# Patient Record
Sex: Female | Born: 1935 | Race: White | Hispanic: No | Smoking: Never smoker
Health system: Southern US, Community
[De-identification: ages and names within clinical notes are randomized; demographics above are authoritative.]

## PROBLEM LIST (undated history)

## (undated) DIAGNOSIS — C787 Secondary malignant neoplasm of liver and intrahepatic bile duct: Secondary | ICD-10-CM

## (undated) DIAGNOSIS — F32A Depression, unspecified: Secondary | ICD-10-CM

## (undated) DIAGNOSIS — I2510 Atherosclerotic heart disease of native coronary artery without angina pectoris: Secondary | ICD-10-CM

## (undated) DIAGNOSIS — C259 Malignant neoplasm of pancreas, unspecified: Secondary | ICD-10-CM

## (undated) DIAGNOSIS — I5031 Acute diastolic (congestive) heart failure: Secondary | ICD-10-CM

## (undated) DIAGNOSIS — F0280 Dementia in other diseases classified elsewhere without behavioral disturbance: Secondary | ICD-10-CM

## (undated) DIAGNOSIS — M1990 Unspecified osteoarthritis, unspecified site: Secondary | ICD-10-CM

## (undated) DIAGNOSIS — H269 Unspecified cataract: Secondary | ICD-10-CM

## (undated) DIAGNOSIS — M069 Rheumatoid arthritis, unspecified: Secondary | ICD-10-CM

## (undated) DIAGNOSIS — K1120 Sialoadenitis, unspecified: Secondary | ICD-10-CM

## (undated) DIAGNOSIS — E039 Hypothyroidism, unspecified: Secondary | ICD-10-CM

## (undated) DIAGNOSIS — G309 Alzheimer's disease, unspecified: Secondary | ICD-10-CM

## (undated) DIAGNOSIS — I10 Essential (primary) hypertension: Secondary | ICD-10-CM

## (undated) DIAGNOSIS — Z888 Allergy status to other drugs, medicaments and biological substances status: Secondary | ICD-10-CM

## (undated) DIAGNOSIS — F329 Major depressive disorder, single episode, unspecified: Secondary | ICD-10-CM

## (undated) HISTORY — DX: Acute diastolic (congestive) heart failure: I50.31

## (undated) HISTORY — DX: Unspecified osteoarthritis, unspecified site: M19.90

## (undated) HISTORY — DX: Sialoadenitis, unspecified: K11.20

## (undated) HISTORY — DX: Malignant neoplasm of pancreas, unspecified: C25.9

## (undated) HISTORY — DX: Alzheimer's disease, unspecified: G30.9

## (undated) HISTORY — DX: Major depressive disorder, single episode, unspecified: F32.9

## (undated) HISTORY — DX: Unspecified cataract: H26.9

## (undated) HISTORY — DX: Rheumatoid arthritis, unspecified: M06.9

## (undated) HISTORY — DX: Atherosclerotic heart disease of native coronary artery without angina pectoris: I25.10

## (undated) HISTORY — DX: Allergy status to other drugs, medicaments and biological substances: Z88.8

## (undated) HISTORY — DX: Dementia in other diseases classified elsewhere, unspecified severity, without behavioral disturbance, psychotic disturbance, mood disturbance, and anxiety: F02.80

## (undated) HISTORY — DX: Depression, unspecified: F32.A

## (undated) HISTORY — DX: Hypothyroidism, unspecified: E03.9

## (undated) HISTORY — DX: Essential (primary) hypertension: I10

## (undated) HISTORY — DX: Malignant neoplasm of pancreas, unspecified: C78.7

---

## 1963-09-07 HISTORY — PX: NASAL POLYP SURGERY: SHX186

## 1999-04-09 ENCOUNTER — Encounter: Payer: Self-pay | Admitting: Internal Medicine

## 1999-09-07 HISTORY — PX: HEMORRHOID SURGERY: SHX153

## 1999-09-07 LAB — HM COLONOSCOPY

## 2000-05-07 HISTORY — PX: ANAL SPHINCTEROTOMY: SHX1140

## 2004-08-26 ENCOUNTER — Ambulatory Visit: Payer: Self-pay | Admitting: Internal Medicine

## 2004-09-06 DIAGNOSIS — I2510 Atherosclerotic heart disease of native coronary artery without angina pectoris: Secondary | ICD-10-CM

## 2004-09-06 HISTORY — DX: Atherosclerotic heart disease of native coronary artery without angina pectoris: I25.10

## 2004-11-04 HISTORY — PX: CORONARY STENT PLACEMENT: SHX1402

## 2004-11-09 ENCOUNTER — Ambulatory Visit: Payer: Self-pay | Admitting: Internal Medicine

## 2004-11-12 ENCOUNTER — Ambulatory Visit: Payer: Self-pay | Admitting: Internal Medicine

## 2004-11-30 ENCOUNTER — Encounter: Payer: Self-pay | Admitting: Internal Medicine

## 2006-08-21 ENCOUNTER — Emergency Department: Payer: Self-pay | Admitting: Emergency Medicine

## 2006-08-23 ENCOUNTER — Ambulatory Visit: Payer: Self-pay | Admitting: Internal Medicine

## 2007-02-08 DIAGNOSIS — M949 Disorder of cartilage, unspecified: Secondary | ICD-10-CM

## 2007-02-08 DIAGNOSIS — I2510 Atherosclerotic heart disease of native coronary artery without angina pectoris: Secondary | ICD-10-CM | POA: Insufficient documentation

## 2007-02-08 DIAGNOSIS — E039 Hypothyroidism, unspecified: Secondary | ICD-10-CM | POA: Insufficient documentation

## 2007-02-08 DIAGNOSIS — M899 Disorder of bone, unspecified: Secondary | ICD-10-CM | POA: Insufficient documentation

## 2007-02-08 DIAGNOSIS — I10 Essential (primary) hypertension: Secondary | ICD-10-CM | POA: Insufficient documentation

## 2007-02-08 DIAGNOSIS — M069 Rheumatoid arthritis, unspecified: Secondary | ICD-10-CM | POA: Insufficient documentation

## 2007-02-08 DIAGNOSIS — F3289 Other specified depressive episodes: Secondary | ICD-10-CM | POA: Insufficient documentation

## 2007-02-08 DIAGNOSIS — F329 Major depressive disorder, single episode, unspecified: Secondary | ICD-10-CM

## 2007-02-09 ENCOUNTER — Ambulatory Visit: Payer: Self-pay | Admitting: Internal Medicine

## 2007-03-17 ENCOUNTER — Encounter (INDEPENDENT_AMBULATORY_CARE_PROVIDER_SITE_OTHER): Payer: Self-pay | Admitting: *Deleted

## 2007-08-09 ENCOUNTER — Telehealth: Payer: Self-pay | Admitting: Internal Medicine

## 2007-08-15 ENCOUNTER — Ambulatory Visit: Payer: Self-pay | Admitting: Internal Medicine

## 2007-08-15 DIAGNOSIS — E785 Hyperlipidemia, unspecified: Secondary | ICD-10-CM

## 2007-08-15 DIAGNOSIS — G589 Mononeuropathy, unspecified: Secondary | ICD-10-CM | POA: Insufficient documentation

## 2007-08-16 ENCOUNTER — Ambulatory Visit: Payer: Self-pay | Admitting: Cardiology

## 2007-08-16 LAB — CONVERTED CEMR LAB
ALT: 17 units/L (ref 0–35)
AST: 25 units/L (ref 0–37)
Albumin: 4.2 g/dL (ref 3.5–5.2)
Basophils Absolute: 0 10*3/uL (ref 0.0–0.1)
Basophils Relative: 0.2 % (ref 0.0–1.0)
Bilirubin, Direct: 0.2 mg/dL (ref 0.0–0.3)
Calcium: 9.9 mg/dL (ref 8.4–10.5)
Creatinine, Ser: 0.7 mg/dL (ref 0.4–1.2)
Free T4: 1 ng/dL (ref 0.6–1.6)
GFR calc Af Amer: 105 mL/min
Glucose, Bld: 92 mg/dL (ref 70–99)
HCT: 39.6 % (ref 36.0–46.0)
HDL: 48.3 mg/dL (ref >39.0)
Hemoglobin: 13.8 g/dL (ref 12.0–15.0)
LDL Cholesterol: 71 mg/dL (ref 0–99)
MCV: 95.1 fL (ref 78.0–100.0)
Neutrophils Relative %: 52.7 % (ref 43.0–77.0)
Potassium: 4.8 meq/L (ref 3.5–5.1)
RBC: 4.16 M/uL (ref 3.87–5.11)
RDW: 12.1 % (ref 11.5–14.6)
Sodium: 136 meq/L (ref 135–145)
TSH: 0.83 microintl units/mL (ref 0.35–5.50)
Total CHOL/HDL Ratio: 2.7
Total Protein: 6.7 g/dL (ref 6.0–8.3)
Triglycerides: 57 mg/dL (ref 0–149)
VLDL: 11 mg/dL (ref 0–40)
Vitamin B-12: 283 pg/mL (ref 211–911)
WBC: 5.9 10*3/uL (ref 4.5–10.5)

## 2007-08-30 ENCOUNTER — Telehealth (INDEPENDENT_AMBULATORY_CARE_PROVIDER_SITE_OTHER): Payer: Self-pay | Admitting: *Deleted

## 2007-11-05 DIAGNOSIS — K1120 Sialoadenitis, unspecified: Secondary | ICD-10-CM

## 2007-11-05 HISTORY — DX: Sialoadenitis, unspecified: K11.20

## 2008-01-17 ENCOUNTER — Encounter: Payer: Self-pay | Admitting: Internal Medicine

## 2008-04-02 ENCOUNTER — Ambulatory Visit: Payer: Self-pay | Admitting: Internal Medicine

## 2008-04-02 ENCOUNTER — Encounter: Payer: Self-pay | Admitting: Internal Medicine

## 2008-04-02 DIAGNOSIS — R21 Rash and other nonspecific skin eruption: Secondary | ICD-10-CM | POA: Insufficient documentation

## 2008-07-08 ENCOUNTER — Ambulatory Visit: Payer: Self-pay | Admitting: Internal Medicine

## 2008-07-09 LAB — CONVERTED CEMR LAB
BUN: 13 mg/dL (ref 6–23)
Calcium: 9.6 mg/dL (ref 8.4–10.5)
Chloride: 103 meq/L (ref 96–112)
Cholesterol: 137 mg/dL (ref 0–200)
Creatinine, Ser: 0.8 mg/dL (ref 0.4–1.2)
Free T4: 0.9 ng/dL (ref 0.6–1.6)
GFR calc Af Amer: 90 mL/min
Glucose, Bld: 91 mg/dL (ref 70–99)
HDL: 46.5 mg/dL (ref >39.0)
Hemoglobin: 13.8 g/dL (ref 12.0–15.0)
LDL Cholesterol: 66 mg/dL (ref 0–99)
MCV: 95.3 fL (ref 78.0–100.0)
Monocytes Absolute: 0.6 10*3/uL (ref 0.1–1.0)
Neutrophils Relative %: 57.1 % (ref 43.0–77.0)
Phosphorus: 3.7 mg/dL (ref 2.3–4.6)
TSH: 0.79 microintl units/mL (ref 0.35–5.50)
Total Bilirubin: 1.1 mg/dL (ref 0.3–1.2)
Total CHOL/HDL Ratio: 2.9
VLDL: 25 mg/dL (ref 0–40)

## 2008-07-31 ENCOUNTER — Telehealth (INDEPENDENT_AMBULATORY_CARE_PROVIDER_SITE_OTHER): Payer: Self-pay | Admitting: *Deleted

## 2008-07-31 ENCOUNTER — Encounter: Payer: Self-pay | Admitting: Internal Medicine

## 2008-09-11 ENCOUNTER — Telehealth: Payer: Self-pay | Admitting: Family Medicine

## 2008-09-18 ENCOUNTER — Telehealth: Payer: Self-pay | Admitting: Internal Medicine

## 2008-09-20 ENCOUNTER — Telehealth: Payer: Self-pay | Admitting: Internal Medicine

## 2008-10-01 ENCOUNTER — Ambulatory Visit: Payer: Self-pay | Admitting: Internal Medicine

## 2008-10-01 DIAGNOSIS — K1120 Sialoadenitis, unspecified: Secondary | ICD-10-CM | POA: Insufficient documentation

## 2008-10-02 ENCOUNTER — Ambulatory Visit: Payer: Self-pay | Admitting: Internal Medicine

## 2008-10-02 ENCOUNTER — Telehealth: Payer: Self-pay | Admitting: Internal Medicine

## 2008-10-03 ENCOUNTER — Ambulatory Visit: Payer: Self-pay | Admitting: Internal Medicine

## 2008-10-04 ENCOUNTER — Ambulatory Visit: Payer: Self-pay | Admitting: Internal Medicine

## 2008-10-14 ENCOUNTER — Telehealth: Payer: Self-pay | Admitting: Internal Medicine

## 2008-10-15 ENCOUNTER — Telehealth: Payer: Self-pay | Admitting: Internal Medicine

## 2008-11-25 ENCOUNTER — Telehealth: Payer: Self-pay | Admitting: Internal Medicine

## 2009-01-09 ENCOUNTER — Telehealth: Payer: Self-pay | Admitting: Internal Medicine

## 2009-01-27 ENCOUNTER — Ambulatory Visit: Payer: Self-pay | Admitting: Internal Medicine

## 2009-01-27 DIAGNOSIS — R5381 Other malaise: Secondary | ICD-10-CM

## 2009-01-27 DIAGNOSIS — R5383 Other fatigue: Secondary | ICD-10-CM

## 2009-01-28 ENCOUNTER — Telehealth: Payer: Self-pay | Admitting: Internal Medicine

## 2009-01-29 LAB — CONVERTED CEMR LAB
BUN: 12 mg/dL (ref 6–23)
Basophils Relative: 0.9 % (ref 0.0–3.0)
Bilirubin, Direct: 0.2 mg/dL (ref 0.0–0.3)
CO2: 29 meq/L (ref 19–32)
Calcium: 9.4 mg/dL (ref 8.4–10.5)
Creatinine, Ser: 0.8 mg/dL (ref 0.4–1.2)
HCT: 39.6 % (ref 36.0–46.0)
Hemoglobin: 13.6 g/dL (ref 12.0–15.0)
Lymphocytes Relative: 29.6 % (ref 12.0–46.0)
Lymphs Abs: 2.3 10*3/uL (ref 0.7–4.0)
MCHC: 34.2 g/dL (ref 30.0–36.0)
MCV: 97 fL (ref 78.0–100.0)
Monocytes Relative: 3 % (ref 3.0–12.0)
Neutrophils Relative %: 63.9 % (ref 43.0–77.0)
Phosphorus: 4.1 mg/dL (ref 2.3–4.6)
Platelets: 235 10*3/uL (ref 150.0–400.0)
Potassium: 5.1 meq/L (ref 3.5–5.1)
RDW: 11.6 % (ref 11.5–14.6)
Sodium: 137 meq/L (ref 135–145)
TSH: 0.59 microintl units/mL (ref 0.35–5.50)
Total Bilirubin: 1.3 mg/dL — ABNORMAL HIGH (ref 0.3–1.2)

## 2009-02-10 ENCOUNTER — Telehealth (INDEPENDENT_AMBULATORY_CARE_PROVIDER_SITE_OTHER): Payer: Self-pay | Admitting: Radiology

## 2009-02-21 ENCOUNTER — Telehealth: Payer: Self-pay | Admitting: Internal Medicine

## 2009-03-17 ENCOUNTER — Telehealth: Payer: Self-pay | Admitting: Internal Medicine

## 2009-03-20 ENCOUNTER — Encounter: Payer: Self-pay | Admitting: Cardiology

## 2009-03-20 ENCOUNTER — Ambulatory Visit: Payer: Self-pay | Admitting: Cardiology

## 2009-04-01 ENCOUNTER — Telehealth (INDEPENDENT_AMBULATORY_CARE_PROVIDER_SITE_OTHER): Payer: Self-pay | Admitting: Radiology

## 2009-04-02 ENCOUNTER — Encounter: Payer: Self-pay | Admitting: Cardiology

## 2009-04-02 ENCOUNTER — Encounter: Payer: Self-pay | Admitting: Cardiovascular Disease

## 2009-04-02 ENCOUNTER — Ambulatory Visit: Payer: Self-pay

## 2009-04-04 ENCOUNTER — Telehealth: Payer: Self-pay | Admitting: Internal Medicine

## 2009-04-09 ENCOUNTER — Ambulatory Visit: Payer: Self-pay | Admitting: Cardiology

## 2009-04-09 DIAGNOSIS — I5032 Chronic diastolic (congestive) heart failure: Secondary | ICD-10-CM

## 2009-04-24 ENCOUNTER — Telehealth: Payer: Self-pay | Admitting: Internal Medicine

## 2009-05-20 ENCOUNTER — Inpatient Hospital Stay: Payer: Medicare Other | Admitting: Internal Medicine

## 2009-05-20 ENCOUNTER — Ambulatory Visit: Payer: Self-pay | Admitting: Cardiology

## 2009-05-20 ENCOUNTER — Encounter (INDEPENDENT_AMBULATORY_CARE_PROVIDER_SITE_OTHER): Payer: Self-pay | Admitting: *Deleted

## 2009-05-20 ENCOUNTER — Encounter: Payer: Self-pay | Admitting: Internal Medicine

## 2009-05-20 LAB — CONVERTED CEMR LAB
CO2: 29 meq/L
Calcium: 8.7 mg/dL
Chloride: 101 meq/L
Creatinine, Ser: 0.81 mg/dL
HDL: 43 mg/dL
Hemoglobin: 13.7 g/dL
Platelets: 247 10*3/uL
Sodium: 137 meq/L
Triglyceride fasting, serum: 154 mg/dL

## 2009-05-26 ENCOUNTER — Encounter (INDEPENDENT_AMBULATORY_CARE_PROVIDER_SITE_OTHER): Payer: Self-pay | Admitting: *Deleted

## 2009-05-26 ENCOUNTER — Ambulatory Visit: Payer: Self-pay | Admitting: Internal Medicine

## 2009-05-26 DIAGNOSIS — R51 Headache: Secondary | ICD-10-CM

## 2009-05-26 DIAGNOSIS — R519 Headache, unspecified: Secondary | ICD-10-CM | POA: Insufficient documentation

## 2009-06-23 ENCOUNTER — Telehealth: Payer: Self-pay | Admitting: Internal Medicine

## 2009-09-06 DIAGNOSIS — M069 Rheumatoid arthritis, unspecified: Secondary | ICD-10-CM

## 2009-09-06 HISTORY — DX: Rheumatoid arthritis, unspecified: M06.9

## 2009-09-23 ENCOUNTER — Ambulatory Visit: Payer: Self-pay | Admitting: Internal Medicine

## 2009-09-24 LAB — CONVERTED CEMR LAB
BUN: 10 mg/dL (ref 6–23)
CO2: 29 meq/L (ref 19–32)
Calcium: 9.5 mg/dL (ref 8.4–10.5)
Cholesterol: 151 mg/dL (ref 0–200)
Creatinine, Ser: 0.7 mg/dL (ref 0.4–1.2)
Eosinophils Relative: 1.9 % (ref 0.0–5.0)
HCT: 41.9 % (ref 36.0–46.0)
HDL: 47.1 mg/dL (ref >39.00)
Hemoglobin: 14.1 g/dL (ref 12.0–15.0)
LDL Cholesterol: 74 mg/dL (ref 0–99)
Lymphocytes Relative: 29.4 % (ref 12.0–46.0)
MCHC: 33.6 g/dL (ref 30.0–36.0)
Neutrophils Relative %: 61.8 % (ref 43.0–77.0)
Platelets: 236 10*3/uL (ref 150.0–400.0)
Potassium: 4.9 meq/L (ref 3.5–5.1)
RBC: 4.32 M/uL (ref 3.87–5.11)
RDW: 12 % (ref 11.5–14.6)
TSH: 0.74 microintl units/mL (ref 0.35–5.50)
Total Bilirubin: 2 mg/dL — ABNORMAL HIGH (ref 0.3–1.2)
Triglycerides: 151 mg/dL — ABNORMAL HIGH (ref 0.0–149.0)
VLDL: 30.2 mg/dL (ref 0.0–40.0)
WBC: 6.5 10*3/uL (ref 4.5–10.5)

## 2009-10-21 ENCOUNTER — Telehealth: Payer: Self-pay | Admitting: Internal Medicine

## 2009-10-23 ENCOUNTER — Telehealth: Payer: Self-pay | Admitting: Internal Medicine

## 2009-11-25 ENCOUNTER — Ambulatory Visit: Payer: Self-pay | Admitting: Internal Medicine

## 2009-11-25 DIAGNOSIS — J019 Acute sinusitis, unspecified: Secondary | ICD-10-CM

## 2009-12-17 ENCOUNTER — Observation Stay: Payer: Medicare Other | Admitting: Internal Medicine

## 2009-12-17 ENCOUNTER — Encounter: Payer: Self-pay | Admitting: Internal Medicine

## 2009-12-18 ENCOUNTER — Ambulatory Visit: Payer: Self-pay | Admitting: Cardiology

## 2009-12-18 ENCOUNTER — Encounter: Payer: Self-pay | Admitting: Internal Medicine

## 2009-12-19 ENCOUNTER — Telehealth: Payer: Self-pay | Admitting: Internal Medicine

## 2009-12-22 ENCOUNTER — Encounter: Payer: Self-pay | Admitting: Internal Medicine

## 2009-12-23 ENCOUNTER — Encounter: Payer: Self-pay | Admitting: Internal Medicine

## 2010-01-02 ENCOUNTER — Ambulatory Visit: Payer: Self-pay | Admitting: Cardiology

## 2010-01-06 ENCOUNTER — Encounter: Payer: Self-pay | Admitting: Internal Medicine

## 2010-01-07 ENCOUNTER — Telehealth: Payer: Self-pay | Admitting: Internal Medicine

## 2010-01-13 ENCOUNTER — Ambulatory Visit: Payer: Self-pay | Admitting: Internal Medicine

## 2010-01-13 DIAGNOSIS — M19049 Primary osteoarthritis, unspecified hand: Secondary | ICD-10-CM

## 2010-01-14 LAB — CONVERTED CEMR LAB: Vitamin B-12: 314 pg/mL (ref 211–911)

## 2010-01-29 ENCOUNTER — Telehealth (INDEPENDENT_AMBULATORY_CARE_PROVIDER_SITE_OTHER): Payer: Self-pay | Admitting: *Deleted

## 2010-02-25 ENCOUNTER — Ambulatory Visit: Payer: Self-pay | Admitting: Internal Medicine

## 2010-02-25 LAB — CONVERTED CEMR LAB
Chloride: 104 meq/L (ref 96–112)
Creatinine, Ser: 0.6 mg/dL (ref 0.4–1.2)
Glucose, Bld: 95 mg/dL (ref 70–99)
Sodium: 138 meq/L (ref 135–145)

## 2010-02-27 ENCOUNTER — Encounter: Admission: RE | Admit: 2010-02-27 | Discharge: 2010-02-27 | Payer: Self-pay | Admitting: Internal Medicine

## 2010-06-02 ENCOUNTER — Telehealth: Payer: Self-pay | Admitting: Internal Medicine

## 2010-06-11 ENCOUNTER — Telehealth: Payer: Self-pay | Admitting: Internal Medicine

## 2010-06-19 ENCOUNTER — Ambulatory Visit: Payer: Self-pay | Admitting: Internal Medicine

## 2010-06-19 DIAGNOSIS — G309 Alzheimer's disease, unspecified: Secondary | ICD-10-CM

## 2010-06-19 DIAGNOSIS — F0280 Dementia in other diseases classified elsewhere without behavioral disturbance: Secondary | ICD-10-CM

## 2010-08-24 ENCOUNTER — Ambulatory Visit: Payer: Self-pay | Admitting: Internal Medicine

## 2010-08-24 ENCOUNTER — Telehealth: Payer: Self-pay | Admitting: Internal Medicine

## 2010-08-28 ENCOUNTER — Ambulatory Visit: Payer: Self-pay | Admitting: Internal Medicine

## 2010-10-04 LAB — CONVERTED CEMR LAB
Free T4: 0.88 ng/dL (ref 0.80–1.80)
TSH: 1.894 microintl units/mL (ref 0.350–4.500)

## 2010-10-06 NOTE — Assessment & Plan Note (Signed)
Summary: 3 M F/U DLO  R/S FROM 06/05/10,06/12/10   Vital Signs:  Patient profile:   77 year old female Weight:      170 pounds Temp:     97.2 degrees F oral Pulse rate:   72 / minute Pulse rhythm:   regular BP sitting:   128 / 80  (left arm) Cuff size:   regular  Vitals Entered By: Mervin Hack CMA Duncan Dull) (June 19, 2010 2:38 PM) CC: 3 month follow-up   History of Present Illness: Dizziness for 1 day last week went away quickly without meds  reviewed benign MRI No clear cut vascular lesions to account for memory problmes  Had recent trip with friends in [place] Now planning trip with these friends to [facility] felt that she was no worse off then they were she has some ongoing memory issues Left her purse in hotel in [place] and didn't realize  overall feels better --better health seems better off some of the meds really enjoyed her chance to have social outing of significance Has a great group where she lives now  [employer] now hasn't given up any other activities Daughter notices that she still repeats herself--- on the ride home from the airport, she told her about the show they saw 3 different times as if they were the first time  Allergies: 1)  ! Aspirin (Aspirin)  Past History:  Past medical, surgical, family and social histories (including risk factors) reviewed for relevance to current acute and chronic problems.  Past Medical History: 1.  CAD:  Anterior MI in [place] in 2006.  2 stents in LAD, 1 stent in PDA.   - Adenosine myoview (7/10): EF 60%, septal apical infarct.  Low risk study.  - Adenosine myoview (4/11): EF > 55%, mid anteroseptal, apical septal, and true apex infarct with no ischemia.  Low risk study.  2 . HTN 3.  Rheumatoid arthritis -----diagnosis in question 2011 4.  Hyperlipidemia 5.  2 prior bouts of parotitis 6.  Hypothyroidism 7.  Depression 8.  Allergy to aspirin: description sounds like anaphylaxis.  9.  Diastolic CHF: echo  (7/10) EF 44-03%, mid to apical anteroseptal hypokinesis, diastolic dysfunction, PASP 33 mmHg.  10.Osteoarthritis 11. Altzheimers  Past Surgical History: Reviewed history from 07/08/2008 and no changes required. 1987 Hysterectomy 9/01  Anal sphincterostomy and fissurectomy - [facility] 1965  Nasal polypectomy           Cataracts 8/99   DEXA 1/01   Hemorrhoid ligation 3/06   MI/3 stents 3/09  Infected salivary gland on left  Family History: Reviewed history from 02/08/2007 and no changes required. Dad died @77  MI No cancer  Social History: Reviewed history from 03/20/2009 and no changes required. Divorced then remarried--widowed 10/02.  5 daughters.  Never Smoked Alcohol use-yes--occ Lived in [place] but has moved to [place].   Review of Systems       appetite is okay sleeps fairly well  Physical Exam  General:  alert and normal appearance.   Psych:  normally interactive, good eye contact, not anxious appearing, and not depressed appearing.     Impression & Recommendations:  Problem # 1:  ALZHEIMERS DISEASE (ICD-331.0) Assessment New seems to fit the criteria daughter notes ongoing sig memory issues but no serious functional issues No worse now  will try donepezil trial  Complete Medication List: 1)  Synthroid 75 Mcg Tabs (Levothyroxine sodium) .Marland Kitchen.. 1 by mouth daily 2)  Plavix 75 Mg Tabs (Clopidogrel bisulfate) .... Take  1 tablet by mouth once a day 3)  Fluoxetine Hcl 20 Mg Caps (Fluoxetine hcl) .... Take 1 tablet by mouth once a day 4)  Carvedilol 12.5 Mg Tabs (Carvedilol) .... 1/2 two times a day 5)  Furosemide 20 Mg Tabs (Furosemide) .... Take one tablet by mouth every other day 6)  Potassium Chloride Cr 10 Meq Cr-caps (Potassium chloride) .... Take one tablet by mouth every other day with furosemide 7)  Calcium 600 1500 Mg Tabs (Calcium carbonate) .Marland Kitchen.. 1 by mouth daily 8)  Meclizine Hcl 25 Mg Tabs (Meclizine hcl) .... 1/2-1 three times a day as needed  for dizziness 9)  Astelin 137 Mcg/spray Soln (Azelastine hcl) .Marland Kitchen.. 1 spray in each nostril two times a day as needed for allergy symptoms 10)  Donepezil Hcl 5 Mg Tabs (Donepezil hcl) .Marland Kitchen.. 1 tab daily for memory loss 11)  Lorazepam 0.5 Mg Tabs (Lorazepam) .... 1/2 by mouth two times a day as needed for nerves  Other Orders: Pneumococcal Vaccine (16109) Admin 1st Vaccine (60454) Admin 1st Vaccine [facility]) [phone]) Influenza Vaccine MCR [phone])  Patient Instructions: 1)  Please schedule a follow-up appointment in 2 months.  Prescriptions: DONEPEZIL HCL 5 MG TABS (DONEPEZIL HCL) 1 tab daily for memory loss  #30 x 11   Entered and Authorized by:   Cindee Salt MD   Signed by:   Cindee Salt MD on 06/19/2010   Method used:   [id number]       [address]       [place], [place]  [address]       Ph: [phone]       Fax: [phone]   RxID:   [id number]   Current Allergies (reviewed today): ! ASPIRIN (ASPIRIN)   Pneumovax Vaccine    Vaccine Type: Pneumovax (Medicare)    Site: right deltoid    Mfr: Merck    Dose: 0.5 ml    Route: IM    Given by: Mervin Hack CMA (AAMA)    Exp. Date: 11/19/2011    Lot #: [id number]    VIS given: 08/11/09 version given June 19, 2010.  Influenza Vaccine    Vaccine Type: Fluvax MCR    Site: left deltoid    Mfr: GlaxoSmithKline    Dose: 0.5 ml    Route: IM    Given by: Mervin Hack CMA (AAMA)    Exp. Date: 03/06/2011    Lot #: [id number]    VIS given: 03/31/10 version given June 19, 2010.  Flu Vaccine Consent Questions    Do you have a history of severe allergic reactions to this vaccine? no    Any prior history of allergic reactions to egg and/or gelatin? no    Do you have a sensitivity to the preservative Thimersol? no    Do you have a past history of Guillan-Barre Syndrome? no    Do you currently have an acute febrile illness? no    Have you ever had a severe  reaction to latex? no    Vaccine information given and explained to patient? yes    Are you currently pregnant? no    Appended Document: 3 M F/U DLO  R/S FROM 06/05/10,06/12/10 Generic aricept called to [facility], they didnt get it on 06/19/10.

## 2010-10-06 NOTE — Assessment & Plan Note (Signed)
Summary: 4 M F/U DLO   Vital Signs:  Patient profile:   77 year old female Weight:      160 pounds Temp:     98.3 degrees F oral BP sitting:   128 / 80  (left arm) Cuff size:   regular  Vitals Entered By: Mervin Hack CMA Duncan Dull) (September 23, 2009 10:42 AM) CC: follow-up   History of Present Illness: Doing fairly well Loves to read--now uses a [place] and this has made it much better on her hands  Has moved into condo at [employer] Really loves it  Tries to be cautious about her activities Travels at the right times for errands, etc No restrictions though  No recent heart trouble Notes some discomfort on right chest Tries to straighten out to help it Points to  ~3rd costocartilage area  Arthritis pain continues still restricted with hands Status is stable  Allergies: 1)  ! Aspirin (Aspirin)  Past History:  Past medical, surgical, family and social histories (including risk factors) reviewed for relevance to current acute and chronic problems.  Past Medical History: Reviewed history from 04/09/2009 and no changes required. 1.  CAD:  Anterior MI in [place] in 2006.  2 stents in LAD, 1 stent in PDA.   - Adenosine myoview (7/10): EF 60%, small septal apical infarct.  Low risk study.  2 . HTN 3.  Rheumatoid arthritis on MTX 4.  Hyperlipidemia 5.  2 prior bouts of parotitis 6.  Hypothyroidism 7.  Depression 8.  Allergy to aspirin: description sounds like anaphylaxis.  9.  Diastolic CHF: echo (7/10) EF 50-55%, mid to apical anteroseptal hypokinesis, diastolic dysfunction, PASP 33 mmHg.   Past Surgical History: Reviewed history from 07/08/2008 and no changes required. 1987 Hysterectomy 9/01  Anal sphincterostomy and fissurectomy - [facility] 1965  Nasal polypectomy           Cataracts 8/99   DEXA 1/01   Hemorrhoid ligation 3/06   MI/3 stents 3/09  Infected salivary gland on left  Family History: Reviewed history from 02/08/2007 and no changes required. Dad  died @77  MI No cancer  Social History: Reviewed history from 03/20/2009 and no changes required. Divorced then remarried--widowed 10/02.  5 daughters.  Never Smoked Alcohol use-yes--occ Lived in [place], [place] but has moved to [place].   Review of Systems       appetite is okay--not a big eater weight down 4# sleeps okay   Physical Exam  General:  alert and normal appearance.   Neck:  supple, no masses, no thyromegaly, no carotid bruits, and no cervical lymphadenopathy.   Lungs:  normal respiratory effort and normal breath sounds.   Heart:  normal rate, regular rhythm, no murmur, and no gallop.   Msk:  same  ulnar deviation and thickening in hands Extremities:  no edema Psych:  normally interactive, good eye contact, not anxious appearing, and not depressed appearing.     Impression & Recommendations:  Problem # 1:  DIASTOLIC HEART FAILURE, CHRONIC (ICD-428.32) Assessment Unchanged good functional status no changes needed  Her updated medication list for this problem includes:    Plavix 75 Mg Tabs (Clopidogrel bisulfate) .Marland Kitchen... Take 1 tablet by mouth once a day    Carvedilol 12.5 Mg Tabs (Carvedilol) .Marland Kitchen... 1 two times a day    Furosemide 20 Mg Tabs (Furosemide) .Marland Kitchen... Take one tablet by mouth every other day  Problem # 2:  RHEUMATOID ARTHRITIS (ICD-714.0) Assessment: Unchanged stable status needs labs again  Her updated  medication list for this problem includes:    Methotrexate 2.5 Mg Tabs (Methotrexate sodium) .Marland KitchenMarland KitchenMarland KitchenMarland Kitchen 4 tabs every week  Problem # 3:  CORONARY ARTERY DISEASE (ICD-414.00) Assessment: Unchanged no recent symptoms no changes needed  Her updated medication list for this problem includes:    Plavix 75 Mg Tabs (Clopidogrel bisulfate) .Marland Kitchen... Take 1 tablet by mouth once a day    Carvedilol 12.5 Mg Tabs (Carvedilol) .Marland Kitchen... 1 two times a day    Furosemide 20 Mg Tabs (Furosemide) .Marland Kitchen... Take one tablet by mouth every other day  Problem # 4:  DEPRESSION  (ICD-311) Assessment: Unchanged mood has been fine  Her updated medication list for this problem includes:    Lorazepam 1 Mg Tabs (Lorazepam) .Marland Kitchen... Take one by mouth twice a day    Fluoxetine Hcl 20 Mg Caps (Fluoxetine hcl) .Marland Kitchen... Take 1 tablet by mouth once a day  Problem # 5:  HYPOTHYROIDISM (ICD-244.9) Assessment: Unchanged no changes needed  Her updated medication list for this problem includes:    Synthroid 75 Mcg Tabs (Levothyroxine sodium) .Marland Kitchen... 1 by mouth daily  Labs Reviewed: TSH: 1.2 (05/20/2009)    Chol: 133 (01/27/2009)   HDL: 43 (05/20/2009)   LDL: 73 (05/20/2009)   TG: 154 (05/20/2009)  Complete Medication List: 1)  Methotrexate 2.5 Mg Tabs (Methotrexate sodium) .... 4 tabs every week 2)  Folic Acid 1 Mg Tabs (Folic acid) .Marland Kitchen.. 1 by mouth daily 3)  Synthroid 75 Mcg Tabs (Levothyroxine sodium) .Marland Kitchen.. 1 by mouth daily 4)  Calcium 600 1500 Mg Tabs (Calcium carbonate) .Marland Kitchen.. 1 by mouth daily 5)  Simvastatin 40 Mg Tabs (Simvastatin) .... Take 1 tablet by mouth once a day 6)  Plavix 75 Mg Tabs (Clopidogrel bisulfate) .... Take 1 tablet by mouth once a day 7)  Lorazepam 1 Mg Tabs (Lorazepam) .... Take one by mouth twice a day 8)  Fluoxetine Hcl 20 Mg Caps (Fluoxetine hcl) .... Take 1 tablet by mouth once a day 9)  Astelin 137 Mcg/spray Soln (Azelastine hcl) .... As directed 10)  Triamcinolone Acetonide 0.1 % Lotn (Triamcinolone acetonide) .... Apply to rash three times a day as needed till clear 11)  Carvedilol 12.5 Mg Tabs (Carvedilol) .Marland Kitchen.. 1 two times a day 12)  Valtrex 1 Gm Tabs (Valacyclovir hcl) .... 2 at onset of cold sore then repeat in 12 hours 13)  Furosemide 20 Mg Tabs (Furosemide) .... Take one tablet by mouth every other day 14)  Potassium Chloride Cr 10 Meq Cr-caps (Potassium chloride) .... Take one tablet by mouth every other day with furosemide  Other Orders: TLB-Lipid Panel (80061-LIPID) TLB-Hepatic/Liver Function Pnl (80076-HEPATIC) TLB-Renal Function Panel  (80069-RENAL) TLB-CBC Platelet - w/Differential (85025-CBCD) TLB-TSH (Thyroid Stimulating Hormone) (84443-TSH) Venipuncture (16109)  Patient Instructions: 1)  Please schedule a follow-up appointment in 4 months .   Current Allergies (reviewed today): ! ASPIRIN (ASPIRIN)

## 2010-10-06 NOTE — Letter (Signed)
[facility]   Im[facility] By: Beau Fanny 12/19/2009 10:07:55  _____________________________________________________________________  External Attachment:    Type:   Image     Comment:   External Document

## 2010-10-06 NOTE — Assessment & Plan Note (Signed)
Summary: 4-6 WEEK FOLLOW UP/[facility]   Vital Signs:  Patient profile:   77 year old female Weight:      170 pounds Temp:     98.5 degrees F oral Pulse rate:   72 / minute Pulse rhythm:   regular BP sitting:   130 / 80  (left arm) Cuff size:   regular  Vitals Entered By: Mervin Hack CMA Duncan Dull) (Tyniesha 22, 2011 11:31 AM) CC: 4-6 week follow-up   History of Present Illness: Off statin and hasn't taken any lorazepam since last visit Did note some equilibrium problems before---this seems to be better no longer having orthostatic dizziness hasn't been taking carvedilol two times a day---discussed that she should use it two times a day   Daughter here She notes no changes Seems more disoriented than memory problems Got up at 4AM and thought it was time to get up---went inot shower, etc in hotel room  Allergies: 1)  ! Aspirin (Aspirin)  Past History:  Past medical, surgical, family and social histories (including risk factors) reviewed for relevance to current acute and chronic problems.  Past Medical History: Reviewed history from 01/13/2010 and no changes required. 1.  CAD:  Anterior MI in [place] in 2006.  2 stents in LAD, 1 stent in PDA.   - Adenosine myoview (7/10): EF 60%, septal apical infarct.  Low risk study.  - Adenosine myoview (4/11): EF > 55%, mid anteroseptal, apical septal, and true apex infarct with no ischemia.  Low risk study.  2 . HTN 3.  Rheumatoid arthritis -----diagnosis in question 2011 4.  Hyperlipidemia 5.  2 prior bouts of parotitis 6.  Hypothyroidism 7.  Depression 8.  Allergy to aspirin: description sounds like anaphylaxis.  9.  Diastolic CHF: echo (7/10) EF 50-55%, mid to apical anteroseptal hypokinesis, diastolic dysfunction, PASP 33 mmHg.  10.Osteoarthritis  Past Surgical History: Reviewed history from 07/08/2008 and no changes required. 1987 Hysterectomy 9/01  Anal sphincterostomy and fissurectomy - [facility] 1965  Nasal polypectomy  Cataracts 8/99   DEXA 1/01   Hemorrhoid ligation 3/06   MI/3 stents 3/09  Infected salivary gland on left  Family History: Reviewed history from 02/08/2007 and no changes required. Dad died @77  MI No cancer  Social History: Reviewed history from 03/20/2009 and no changes required. Divorced then remarried--widowed 10/02.  5 daughters.  Never Smoked Alcohol use-yes--occ Lived in [place], [place] but has moved to [place].   Review of Systems       no incontinence duaghter does note that her walking is off some---seems to "miss" steps at times  Physical Exam  General:  alert.  NAD Psych:  normally interactive, good eye contact, not anxious appearing, and not depressed appearing.  Still defensive and in denial   Impression & Recommendations:  Problem # 1:  MEMORY LOSS (ICD-780.93) Assessment Comment Only  counselled 15 minutes some gait problems---will check MRI to make sure no NPH if vascular issues---probably needs the statin again  will recheck in 3 months----I would recommend trial of aricept if no other diagnosis  Orders: Radiology Referral (Radiology)  Complete Medication List: 1)  Synthroid 75 Mcg Tabs (Levothyroxine sodium) .Marland Kitchen.. 1 by mouth daily 2)  Plavix 75 Mg Tabs (Clopidogrel bisulfate) .... Take 1 tablet by mouth once a day 3)  Fluoxetine Hcl 20 Mg Caps (Fluoxetine hcl) .... Take 1 tablet by mouth once a day 4)  Carvedilol 12.5 Mg Tabs (Carvedilol) .... 1/2 two times a day 5)  Furosemide 20 Mg Tabs (Furosemide) .Marland KitchenMarland KitchenMarland Kitchen  Take one tablet by mouth every other day 6)  Potassium Chloride Cr 10 Meq Cr-caps (Potassium chloride) .... Take one tablet by mouth every other day with furosemide 7)  Calcium 600 1500 Mg Tabs (Calcium carbonate) .Marland Kitchen.. 1 by mouth daily  Patient Instructions: 1)  Please schedule a follow-up appointment in 3 months .  2)  Please set up MRI  Current Allergies (reviewed today): ! ASPIRIN (ASPIRIN)  Appended Document: Orders  Update    Clinical Lists Changes  Orders: Added new Service order of Venipuncture (16109) - Signed Added new Test order of TLB-BMP (Basic Metabolic Panel-BMET) (80048-METABOL) - Signed

## 2010-10-06 NOTE — Progress Notes (Signed)
Summary: needs something for dizziness  Phone Note Call from Patient Call back at Home Phone [phone]   Caller: Patient Call For: Cindee Salt MD Summary of Call: Pt states she has had some dizziness since the week end.  This seems to be getting worse.  She thought she had an appt on friday, for a 3 month follow up,  but I told her that has been changed to 10/07 and she says she will not be in town that day.   She is asking if you can work her in this week, and also if something can be called to [facility] for the dizziness. Initial call taken by: Lowella Petties CMA,  June 02, 2010 11:22 AM  Follow-up for Phone Call        can work her in tomorrow at 12:30PM or else it will have to wait till next week  Have her try OTC meclizine 25 mg 1/2-1 three times a day as needed for dizziness until her evaluation Follow-up by: Cindee Salt MD,  June 02, 2010 2:14 PM  Additional Follow-up for Phone Call Additional follow up Details #1::        rx sent to pharmacy, pt will call for appt if medication doesn't work. Additional Follow-up by: Mervin Hack CMA Duncan Dull),  June 02, 2010 3:16 PM    New/Updated Medications: MECLIZINE HCL 25 MG TABS (MECLIZINE HCL) 1/2-1 three times a day as needed for dizziness Prescriptions: MECLIZINE HCL 25 MG TABS (MECLIZINE HCL) 1/2-1 three times a day as needed for dizziness  #30 x 0   Entered by:   Mervin Hack CMA (AAMA)   Authorized by:   Cindee Salt MD   Signed by:   Mervin Hack CMA (AAMA) on 06/02/2010   Method used:   [facility]        [id number]       [address]       [place], [place]  [address]       Ph: [phone]       Fax: [phone]   RxID:   [id number]

## 2010-10-06 NOTE — Progress Notes (Signed)
Summary: refill request for astelin  Phone Note Refill Request Call back at Home Phone [phone] Message from:  Patient  Refills Requested: Medication #1:  astelin nasal spray Phoned request from pt, this is no longer on med list, pt says she has not used it in awhile but needs it now.  Please send to [facility].  Initial call taken by: Lowella Petties CMA,  June 11, 2010 10:28 AM  Follow-up for Phone Call        Okay to send #1 x 5 refills 1 spray in each nostril two times a day as needed for allergy symptoms Follow-up by: Cindee Salt MD,  June 11, 2010 1:29 PM  Additional Follow-up for Phone Call Additional follow up Details #1::        Rx faxed to pharmacy, left message on machine that rx was sent to the pharmacy. Additional Follow-up by: Mervin Hack CMA Duncan Dull),  June 11, 2010 2:56 PM    New/Updated Medications: ASTELIN 137 MCG/SPRAY SOLN (AZELASTINE HCL) 1 spray in each nostril two times a day as needed for allergy symptoms Prescriptions: ASTELIN 137 MCG/SPRAY SOLN (AZELASTINE HCL) 1 spray in each nostril two times a day as needed for allergy symptoms  #1 x 5   Entered by:   Mervin Hack CMA (AAMA)   Authorized by:   Cindee Salt MD   Signed by:   Mervin Hack CMA (AAMA) on 06/11/2010   Method used:   Elect[facility]ically to        [id number]       [address]       [place], [place]  [address]       Ph: [phone]       Fax: [phone]   RxID:   [id number]

## 2010-10-06 NOTE — Progress Notes (Signed)
Summary: Rx Lorazepam  Phone Note Refill Request Call back at [phone] Message from:  [facility] on October 23, 2009 3:00 PM  Refills Requested: Medication #1:  LORAZEPAM 1 MG TABS take one by mouth twice a day   Last Refilled: 09/23/2009 Received faxed refill request, please advise.     Method Requested: Telephone to Pharmacy Initial call taken by: Linde Gillis CMA Duncan Dull),  October 23, 2009 3:00 PM  Follow-up for Phone Call        okay #60 x 3 Follow-up by: Cindee Salt MD,  October 23, 2009 5:37 PM  Additional Follow-up for Phone Call Additional follow up Details #1::        Rx called to pharmacy Additional Follow-up by: DeShannon Smith CMA Duncan Dull),  October 24, 2009 7:50 AM    Prescriptions: LORAZEPAM 1 MG TABS (LORAZEPAM) take one by mouth twice a day  #60 x 3   Entered by:   Mervin Hack CMA (AAMA)   Authorized by:   Cindee Salt MD   Signed by:   Mervin Hack CMA (AAMA) on 10/24/2009   Method used:   Telephoned to ...       [id number]       [address]       [place], [place]  [address]       Ph: [phone]       Fax: [phone]   RxID:   [id number]

## 2010-10-06 NOTE — Assessment & Plan Note (Signed)
Summary: ?SINUS INFECTION/CLE   Vital Signs:  Patient profile:   77 year old female Weight:      165 pounds Temp:     98.3 degrees F oral Resp:     16 per minute BP sitting:   110 / 68  (left arm) Cuff size:   regular  Vitals Entered By: Mervin Hack CMA Duncan Dull) (November 25, 2009 10:29 AM) CC: sinus   History of Present Illness: Has been sick the past week Hacking cough--seems slightly better---dry SInuses congested with lots of drainage--thick yellow  No fever No sig SOB No sore throat mild right ear discomfort  no meds for this  Allergies: 1)  ! Aspirin (Aspirin)  Past History:  Past medical, surgical, family and social histories (including risk factors) reviewed for relevance to current acute and chronic problems.  Past Medical History: Reviewed history from 04/09/2009 and no changes required. 1.  CAD:  Anterior MI in [place] in 2006.  2 stents in LAD, 1 stent in PDA.   - Adenosine myoview (7/10): EF 60%, small septal apical infarct.  Low risk study.  2 . HTN 3.  Rheumatoid arthritis on MTX 4.  Hyperlipidemia 5.  2 prior bouts of parotitis 6.  Hypothyroidism 7.  Depression 8.  Allergy to aspirin: description sounds like anaphylaxis.  9.  Diastolic CHF: echo (7/10) EF 50-55%, mid to apical anteroseptal hypokinesis, diastolic dysfunction, PASP 33 mmHg.   Past Surgical History: Reviewed history from 07/08/2008 and no changes required. 1987 Hysterectomy 9/01  Anal sphincterostomy and fissurectomy - [facility] 1965  Nasal polypectomy           Cataracts 8/99   DEXA 1/01   Hemorrhoid ligation 3/06   MI/3 stents 3/09  Infected salivary gland on left  Family History: Reviewed history from 02/08/2007 and no changes required. Dad died @75  MI No cancer  Social History: Reviewed history from 03/20/2009 and no changes required. Divorced then remarried--widowed 10/02.  5 daughters.  Never Smoked Alcohol use-yes--occ Lived in [place], [place] but has moved to  [place].   Review of Systems       No vomiting or diarrhea appetite is fine Otherwise doing well weight up 5#  Physical Exam  General:  alert.  NAD Head:  mild maxillary tenderness Ears:  R ear normal and L ear normal.   Nose:  moderate congestion some green mucus on left small polyp on right Mouth:  no erythema and no exudates.   Neck:  supple, no masses, and no cervical lymphadenopathy.   Lungs:  normal respiratory effort and normal breath sounds.     Impression & Recommendations:  Problem # 1:  SINUSITIS - ACUTE-NOS (ICD-461.9) Assessment New may be improving now will use supportive care with analgesics  if worsens, will fill Rx for amoxicillin  Complete Medication List: 1)  Methotrexate 2.5 Mg Tabs (Methotrexate sodium) .... 4 tabs every week 2)  Folic Acid 1 Mg Tabs (Folic acid) .Marland Kitchen.. 1 by mouth daily 3)  Synthroid 75 Mcg Tabs (Levothyroxine sodium) .Marland Kitchen.. 1 by mouth daily 4)  Calcium 600 1500 Mg Tabs (Calcium carbonate) .Marland Kitchen.. 1 by mouth daily 5)  Simvastatin 40 Mg Tabs (Simvastatin) .... Take 1 tablet by mouth once a day 6)  Plavix 75 Mg Tabs (Clopidogrel bisulfate) .... Take 1 tablet by mouth once a day 7)  Lorazepam 1 Mg Tabs (Lorazepam) .... Take one by mouth twice a day 8)  Fluoxetine Hcl 20 Mg Caps (Fluoxetine hcl) .... Take 1 tablet by mouth  once a day 9)  Astelin 137 Mcg/spray Soln (Azelastine hcl) .... As directed 10)  Triamcinolone Acetonide 0.1 % Lotn (Triamcinolone acetonide) .... Apply to rash three times a day as needed till clear 11)  Carvedilol 12.5 Mg Tabs (Carvedilol) .Marland Kitchen.. 1 two times a day 12)  Valtrex 1 Gm Tabs (Valacyclovir hcl) .... 2 at onset of cold sore then repeat in 12 hours 13)  Furosemide 20 Mg Tabs (Furosemide) .... Take one tablet by mouth every other day 14)  Potassium Chloride Cr 10 Meq Cr-caps (Potassium chloride) .... Take one tablet by mouth every other day with furosemide 15)  Amoxicillin 500 Mg Tabs (Amoxicillin) .... 2 tabs by mouth  two times a day for sinus infection  Patient Instructions: 1)  Please cancel the May appt and set up follow up appt in 4 months Prescriptions: AMOXICILLIN 500 MG TABS (AMOXICILLIN) 2 tabs by mouth two times a day for sinus infection  #40 x 0   Entered and Authorized by:   Cindee Salt MD   Signed by:   Cindee Salt MD on 11/25/2009   Method used:   Print then Give to Patient   RxID:   [id number]   Current Allergies (reviewed today): ! ASPIRIN (ASPIRIN)  Appended Document: [facility] now message left on her phone

## 2010-10-06 NOTE — Progress Notes (Signed)
Summary: refill request for methotrexate  Phone Note Refill Request Message from:  Fax from Pharmacy  Refills Requested: Medication #1:  METHOTREXATE 2.5 MG TABS 4 tabs every week   Last Refilled: 10/10/2009 Faxed request from [employer] is on your desk.  The folic acid and coreg have been refilled.  Initial call taken by: Lowella Petties CMA,  October 21, 2009 12:44 PM  Follow-up for Phone Call        Rx completed in Dr. Tiajuana Amass Follow-up by: Cindee Salt MD,  October 21, 2009 1:14 PM    Prescriptions: METHOTREXATE 2.5 MG TABS (METHOTREXATE SODIUM) 4 tabs every week  #20 x 11   Entered and Authorized by:   Cindee Salt MD   Signed by:   Cindee Salt MD on 10/21/2009   Method used:   [id number]       [address]       [place], [place]  [address]       Ph: [phone]       Fax: [phone]   RxID:   [id number]

## 2010-10-06 NOTE — Assessment & Plan Note (Signed)
Summary: hospital f/u [facility]   Vital Signs:  Patient profile:   77 year old female Weight:      171 pounds Temp:     98.2 degrees F oral Pulse rate:   72 / minute Pulse rhythm:   regular BP sitting:   122 / 68  (left arm) Cuff size:   regular  Vitals Entered By: Mervin Hack CMA Duncan Dull) (Jan 13, 2010 10:22 AM) CC: hospital follow-up   History of Present Illness: Daughter Gavin Pound here with her  [place] still feels very tired  Shocker was that Dr Gavin Potters doesn't feel that she has RA Originally made at [facility] clinic in [place] Now off the methotrexate Continues to have pain with walking severe disability continues in her hands does have some AM stiffness  recent hospitalization for chest pain no MI stress test reassuring No heartburn issues--no indegestion, no burping supposedly had abnormal EGD [facility] some years ago  daughter concerned about memory issues has been confused doesn't remember that she was told she had MI got lost for 2 hours getting back from vet's office Still manages finances--much is automatic though trouble keeping the day of the week straight  Allergies: 1)  ! Aspirin (Aspirin)  Past History:  Past medical, surgical, family and social histories (including risk factors) reviewed for relevance to current acute and chronic problems.  Past Medical History: 1.  CAD:  Anterior MI in [place] in 2006.  2 stents in LAD, 1 stent in PDA.   - Adenosine myoview (7/10): EF 60%, septal apical infarct.  Low risk study.  - Adenosine myoview (4/11): EF > 55%, mid anteroseptal, apical septal, and true apex infarct with no ischemia.  Low risk study.  2 . HTN 3.  Rheumatoid arthritis -----diagnosis in question 2011 4.  Hyperlipidemia 5.  2 prior bouts of parotitis 6.  Hypothyroidism 7.  Depression 8.  Allergy to aspirin: description sounds like anaphylaxis.  9.  Diastolic CHF: echo  (7/10) EF 16-10%, mid to apical anteroseptal hypokinesis, diastolic dysfunction, PASP 33 mmHg.  10.Osteoarthritis  Past Surgical History: Reviewed history from 07/08/2008 and no changes required. 1987 Hysterectomy 9/01  Anal sphincterostomy and fissurectomy - [facility] 1965  Nasal polypectomy           Cataracts 8/99   DEXA 1/01   Hemorrhoid ligation 3/06   MI/3 stents 3/09  Infected salivary gland on left  Family History: Reviewed history from 02/08/2007 and no changes required. Dad died @75  MI No cancer  Social History: Reviewed history from 03/20/2009 and no changes required. Divorced then remarried--widowed 10/02.  5 daughters.  Never Smoked Alcohol use-yes--occ Lived in [place], [place] but has moved to [place].   Review of Systems       sleeping okay--but doesn't stay in bed that long (does sleep 8 hours in general) appetite is decreased--prefers ice cream now weight is up 10# in past 4 months Not very active   Physical Exam  General:  alert.  NAD Neck:  supple, no masses, and no thyromegaly.   Lungs:  normal respiratory effort and normal breath sounds.   Heart:  normal rate, regular rhythm, no murmur, and no gallop.   Neurologic:  Knows month and year but quickly said Ruba, then corrected Presidents--"Obama, Bush, Bush father" (couldn't get [place]) Recall 2/3 after 3 minutes Gait is normal Psych:  normally interactive and good eye contact.   Mood okay but very defensive about memory  Impression & Recommendations:  Problem # 1:  MEMORY LOSS (ICD-780.93) Assessment New  seems to have mild cognitive impairment denial and defensiveness noted will check RPR, vitamin B12 hold statin and try to minimize the lorazepam  recheck soon  consider MRI but vascular less likely  Orders: Venipuncture (33295) Specimen Handling (18841) TLB-B12, Serum-Total ONLY (66063-K16) T-RPR (Syphilis) ([id number])  Complete Medication List: 1)  Synthroid 75 Mcg Tabs  (Levothyroxine sodium) .Marland Kitchen.. 1 by mouth daily 2)  Calcium 600 1500 Mg Tabs (Calcium carbonate) .Marland Kitchen.. 1 by mouth daily 3)  Simvastatin 40 Mg Tabs (Simvastatin) .... On hold now please 4)  Plavix 75 Mg Tabs (Clopidogrel bisulfate) .... Take 1 tablet by mouth once a day 5)  Lorazepam 1 Mg Tabs (Lorazepam) .... Take 1/2 tab  by mouth twice a day as needed 6)  Fluoxetine Hcl 20 Mg Caps (Fluoxetine hcl) .... Take 1 tablet by mouth once a day 7)  Carvedilol 12.5 Mg Tabs (Carvedilol) .... 1/2 two times a day 8)  Furosemide 20 Mg Tabs (Furosemide) .... Take one tablet by mouth every other day 9)  Potassium Chloride Cr 10 Meq Cr-caps (Potassium chloride) .... Take one tablet by mouth every other day with furosemide  Patient Instructions: 1)  Please hold on taking the simvastatin--don't take it 2)  Please take as little lorazepam as possible 3)  Please schedule a follow-up appointment in 4-6 weeks.   Current Allergies (reviewed today): ! ASPIRIN (ASPIRIN)

## 2010-10-06 NOTE — Progress Notes (Signed)
Summary: pt is not feeling well  Phone Note Call from Patient Call back at [phone]   Caller: Patient Call For: Leslie Salt MD Summary of Call: Pt was recently in hospital and since getting out she has felt very tired and weak.  Feels dizzy.  She has appt to see you on 5/10 but she has to go out of town this week end and is worried that she will feel bad.  She has to go to her grand daughter's graduation [place] the [place].  She is asking if there is anything she can do to feel better until she sees you.  She is just having a very hard time feeling well again since  being hospitalized.  Please advise. Initial call taken by: Lowella Petties CMA,  Jan 07, 2010 12:35 PM  Follow-up for Phone Call        not sure can make sure she is eating right try to get out walking --sometimes increases energy levels  I will be out till Monday. Can try to get her in with another doctor if she feels she needs to be seen before the weekend Leslie Salt MD  Jan 07, 2010 1:18 PM   left message on machine at home for patient to return my call.  DeShannon Smith CMA Duncan Dull)  Jan 07, 2010 3:04 PM   pt has appt sch 01/13/2010, never returned my call Follow-up by: Mervin Hack CMA Duncan Dull),  Jan 09, 2010 11:17 AM  Additional Follow-up for Phone Call Additional follow up Details #1::        okay  will see her then unless she calls earlier Additional Follow-up by: Leslie Salt MD,  Jan 10, 2010 12:59 PM

## 2010-10-06 NOTE — Progress Notes (Signed)
Summary: FLUOXETINE  Phone Note [facility]ill Request Message [id number]  [phone] on Jan 28, 2009 9:41 AM  Refills Requested: Medication #1:  FLUOXETINE HCL 20 MG  CAPS Take 1 tablet by mouth once a day   Last Refilled: 01/01/2009 [facility] Request    Method Requested: Telephone to Pharmacy Initial call taken by: Mervin Hack CMA,  Jan 28, 2009 9:41 AM  Follow-up for Phone Call        okay to refill for 1 year Follow-up by: Cindee Salt MD,  Jan 28, 2009 9:57 AM  Additional Follow-up for Phone Call Additional follow up Details #1::        Rx faxed to pharmacy Additional Follow-up by: Mervin Hack CMA,  Jan 28, 2009 10:29 AM      Prescriptions: FLUOXETINE HCL 20 MG  CAPS (FLUOXETINE HCL) Take 1 tablet by mouth once a day  #30 x 12   Entered by:   Mervin Hack CMA   Authorized by:   Cindee Salt MD   Signed by:   Mervin Hack CMA on 01/28/2009   Method used:   [facility]        [facility]  [address] [phone] (retail)       [address]       [place], [place]  [address]       Ph: [phone]       Fax: [phone]   RxID:   [id number]

## 2010-10-06 NOTE — Progress Notes (Signed)
Summary: needs referral   Phone Note Call from Patient Call back at [phone]   Caller: Patient Call For: Cindee Salt MD Summary of Call: Dr. Shirlee Latch has told patient to call and ask for you to give patient a referall to a rheumatologist.  She was just in the hospital a few days ago, they think she had a minor heart attack and think it was due to her have rheumatoid arthritis. . She says that she has seen Dr. Gavin Potters in the past, but will need a referall because it has been some time ago. Please advise. Initial call taken by: Melody Comas,  December 19, 2009 9:08 AM  Follow-up for Phone Call        okay to make referral to Dr Gavin Potters Follow-up by: Cindee Salt MD,  December 19, 2009 9:47 AM  Additional Follow-up for Phone Call Additional follow up Details #1::        Appt made with Dr Lavenia Atlas on 12/22/2009 at 10:15. Additional Follow-up by: Carlton Adam,  December 19, 2009 10:47 AM

## 2010-10-06 NOTE — Assessment & Plan Note (Signed)
Summary: [facility]   Primary Provider:  Cindee Salt MD  CC:  [facility]  History of Present Illness: 77 yo with h/o CAD s/p anterior MI, HTN, and history of rheumatoid arthritis presents for followup.  She was admitted in 4/11 to [facility] with chest pain.  Lexiscan myoview showed EF > 55% with mid anteroseptal, apical septal, and apical fixed defect consistent with her known prior anterior MI.  She ruled out for MI.  Since that time, she has had no further chest pain.  She continues to be fatigued and "tired all the time."  She is frustrated because no one can figure out why she feels bad.  She is not particularly short of breath.  She does say that she gets lightheaded when she stands and feels excessively "drained" with exertion.  No cough, no fever.  She was apparently seen by a rheumatologist while in [facility] and told that she does not actually have rheumatoid arthritis.  Therefore, she was told to stop methotrexate.  She denies any pains.   Labs (5/10): K 5.1, creatinine 0.8, HCT 39.6, TSH normal, LDL 62, HDL 47 Labs (4/11): K 4.2, creatinine 0.64, LDL 73, HDL 48, cardiac enzymes negative x 3, TSH and free T4 normal, ESR and CRP normal  ECG: NSR, normal  Current Medications (verified): 1)  Methotrexate 2.5 Mg Tabs (Methotrexate Sodium) .... 4 Tabs Every Week (Holding X 1 Week) 2)  Folic Acid 1 Mg Tabs (Folic Acid) .Marland Kitchen.. 1 By Mouth Daily 3)  Synthroid 75 Mcg Tabs (Levothyroxine Sodium) .Marland Kitchen.. 1 By Mouth Daily 4)  Calcium 600 1500 Mg Tabs (Calcium Carbonate) .Marland Kitchen.. 1 By Mouth Daily 5)  Simvastatin 40 Mg  Tabs (Simvastatin) .... Take 1 Tablet By Mouth Once A Day 6)  Plavix 75 Mg  Tabs (Clopidogrel Bisulfate) .... Take 1 Tablet By Mouth Once A Day 7)  Lorazepam 1 Mg Tabs (Lorazepam) .... Take One By Mouth Twice A Day 8)  Fluoxetine Hcl 20 Mg  Caps (Fluoxetine Hcl) .... Take 1 Tablet By Mouth Once A Day 9)  Astelin 137 Mcg/spray  Soln (Azelastine Hcl) .... As Directed 10)   Triamcinolone Acetonide 0.1 %  Lotn (Triamcinolone Acetonide) .... Apply To Rash Three Times A Day As Needed Till Clear 11)  Carvedilol 12.5 Mg Tabs (Carvedilol) .Marland Kitchen.. 1 Two Times A Day 12)  Valtrex 1 Gm Tabs (Valacyclovir Hcl) .... 2 At Onset of Cold Sore Then Repeat in 12 Hours 13)  Furosemide 20 Mg Tabs (Furosemide) .... Take One Tablet By Mouth Every Other Day 14)  Potassium Chloride Cr 10 Meq Cr-Caps (Potassium Chloride) .... Take One Tablet By Mouth Every Other Day With Furosemide  Allergies: 1)  ! Aspirin (Aspirin)  Past History:  Past Medical History: 1.  CAD:  Anterior MI in [place] in 2006.  2 stents in LAD, 1 stent in PDA.   - Adenosine myoview (7/10): EF 60%, septal apical infarct.  Low risk study.  - Adenosine myoview (4/11): EF > 55%, mid anteroseptal, apical septal, and true apex infarct with no ischemia.  Low risk study.  2 . HTN 3.  Rheumatoid arthritis on MTX 4.  Hyperlipidemia 5.  2 prior bouts of parotitis 6.  Hypothyroidism 7.  Depression 8.  Allergy to aspirin: description sounds like anaphylaxis.  9.  Diastolic CHF: echo (7/10) EF 50-55%, mid to apical anteroseptal hypokinesis, diastolic dysfunction, PASP 33 mmHg.   Family History: Reviewed history from 02/08/2007 and no changes required. Dad  died @75  MI No cancer  Social History: Reviewed history from 03/20/2009 and no changes required. Divorced then remarried--widowed 10/02.  5 daughters.  Never Smoked Alcohol use-yes--occ Lived in [place], [place] but has moved to [place].   Review of Systems       All systems reviewed and negative except as per HPI.   Vital Signs:  Patient profile:   77 year old female Height:      65 inches Weight:      168.75 pounds BMI:     28.18 Pulse rate:   60 / minute BP sitting:   120 / 64  (left arm) Cuff size:   regular  Vitals Entered By: Stanton Kidney, EMT-P (January 02, 2010 10:11 AM)  Physical Exam  General:  Well developed, well nourished, in no acute  distress. Neck:  Neck supple, no JVD. No masses, thyromegaly or abnormal cervical nodes. Lungs:  Clear bilaterally to auscultation and percussion. Heart:  Non-displaced PMI, chest non-tender; regular rate and rhythm, S1, S2 without murmurs, rubs or gallops. Carotid upstroke normal, no bruit. Pedals normal pulses. No edema, no varicosities. Abdomen:  Bowel sounds positive; abdomen soft and non-tender without masses, organomegaly, or hernias noted. No hepatosplenomegaly. Extremities:  No clubbing or cyanosis. Neurologic:  Alert and oriented x 3. Psych:  Normal affect.   Impression & Recommendations:  Problem # 1:  FATIGUE (ICD-780.79) Patient's main complaint is fatigue.  This has been present for over a year but has been worse over the last month.  She had no ischemia on myoview (no change from prior study).  She does not appear volume overloaded.  TSH/free T4 were normal.  She does not have arthritis symptoms and ESR was normal.  She does report lightheadedness with standing.  Perhaps the beta blocker is responsible for her symptoms.  I will decrease Coreg to 6.25 mg two times a day to see if this helps.  Will call for BP check in a couple of weeks to make sure that BP does not get too high on the lower dose of Coreg.  Depression would also be a consideration.    Problem # 2:  DIASTOLIC HEART FAILURE, CHRONIC (ICD-428.32) Patient appears euvolemic and does not have much shortness of breath.  Continue Lasix every other day.   Problem # 3:  CORONARY ARTERY DISEASE (ICD-414.00) Myoview in the hospital showed an anteroapical infarct, similar to prior study and corresponding to known prior anterior MI.  No ischemia.  She has had no further chest pain.  Continue Plavix, Zocor, Coreg.  Has ASA allergy. If she has further chest pain, would consider cath.   Problem # 4:  HYPERLIPIDEMIA (ICD-272.4) Lipids at goal 4/11 at [facility].   Other Orders: T-TSH [phone]) T-Sed Rate (Automated)  [phone]) T-T4, Free [phone]) T-C-Reactive Protein [phone])  Patient Instructions: 1)  Your physician recommends that you schedule a follow-up appointment in: 3 months 2)  Your physician has requested that you regularly monitor and record your blood pressure readings at home.  Please use the same machine at the same time of day to check your readings and record them.  We will call you in 2 weeks to see how your BP is running. 3)  Your physician has recommended you make the following change in your medication: decrease coreg to 6.25 mg twice a day  Appended Document: [facility] 1[facility] Called spoke with pt.  Pt states she has not been monitoring BP's regularly as requested, she doesn't have a monitor  at home.  But pt states she is feeling very well overall and has since f/u with Dr Alphonsus Sias as Dr Shirlee Latch requested and she is doing well.  EWJ

## 2010-10-06 NOTE — Medication Information (Signed)
Summary: [facility] Patient Home Medications  [facility] Patient Home Medications   Imported By: Beau Fanny 12/19/2009 15:06:02  _____________________________________________________________________  External Attachment:    Type:   Image     Comment:   External Document

## 2010-10-06 NOTE — Letter (Signed)
Summary: Dr.G.Lyna Poser  Dr.G.Lyna Poser   Imported By: Beau Fanny 01/13/2010 16:37:13  _____________________________________________________________________  External Attachment:    Type:   Image     Comment:   External Document  Appended Document: Dr.G.Earlene Plater [facility],Rheumatology,Note off MTX  Not clear this is RA

## 2010-10-06 NOTE — Progress Notes (Signed)
Summary: regarding meds  Phone Note Call from Patient   Caller: Patient Summary of Call: Pt called to have someone go over her medicine list with her, asked what the instructions for her meds were at her last office visit.  Advised that she was to hold off on simvastatin and take as little lorazepam as possible.  She said she only takes about 3 a month.           Lowella Petties CMA  Jan 29, 2010 3:08 PM

## 2010-10-06 NOTE — Consult Note (Signed)
Summary: [facility]  [facility]   Imported By: Lanelle Bal 01/05/2010 09:11:26  _____________________________________________________________________  External Attachment:    Type:   Image     Comment:   External Document  Appended Document: [facility] not sure she really has RA

## 2010-10-06 NOTE — Letter (Signed)
Summary: [facility] Discharge Summary  [facility] Discharge Summary   Imported By: Beau Fanny 01/15/2010 11:52:17  _____________________________________________________________________  External Attachment:    Type:   Image     Comment:   External Document

## 2010-10-08 NOTE — Progress Notes (Signed)
Summary: Missed appt  Phone Note Outgoing Call Call back at Home Phone [phone]   Call placed by: DeShannon Katrinka Blazing CMA Duncan Dull),  August 24, 2010 2:10 PM Call placed to: Patient/ daughter Summary of Call: calling pt's daughter and pt to see why she missed her appt today. Initial call taken by: Mervin Hack CMA Duncan Dull),  August 24, 2010 2:11 PM  Follow-up for Phone Call        left message on machine at home for patient to return my call. called daughter and child answered phone, will try again later. DeShannon Katrinka Blazing CMA Duncan Dull)  August 24, 2010 2:11 PM    spoke to patient, her daughter was suppose to re-schedule her appt and she didn't, patient made appt for Friday @ 11:00am, she say's sorry for the confusion, and it was her daughter's fault. DeShannon Smith CMA Duncan Dull)  August 25, 2010 9:48 AM    noted Follow-up by: Cindee Salt MD,  August 25, 2010 9:58 AM

## 2010-10-08 NOTE — Assessment & Plan Note (Signed)
Summary: 2 MTH FU/CLE   Vital Signs:  Patient profile:   77 year old female Height:      65 inches Weight:      169.50 pounds BMI:     28.31 Temp:     98.1 degrees F oral Pulse rate:   70 / minute Pulse rhythm:   regular BP sitting:   120 / 72  (left arm) Cuff size:   regular  Vitals Entered By: Mervin Hack CMA [facility]) (August 28, 2010 11:14 AM) CC: 2 month follow up   History of Present Illness: No prolbems tolerating the donepezil did have several days that her stomach wasn't right--seems to be better now  she feels more confident Keeps her calendar and feels she is sharper Able to use this and stay functional  still forgetful about things if not written down Repeats conversations and questions forgets that she spoke to daughter within a few hours  Mood has been good still enjoying her life  Allergies: 1)  ! Aspirin (Aspirin)  Past History:  Past medical, surgical, family and social histories (including risk factors) reviewed for relevance to current acute and chronic problems.  Past Medical History: Reviewed history from 06/19/2010 and no changes required. 1.  CAD:  Anterior MI in [place] in 2006.  2 stents in LAD, 1 stent in PDA.   - Adenosine myoview (7/10): EF 60%, septal apical infarct.  Low risk study.  - Adenosine myoview (4/11): EF > 55%, mid anteroseptal, apical septal, and true apex infarct with no ischemia.  Low risk study.  2 . HTN 3.  Rheumatoid arthritis -----diagnosis in question 2011 4.  Hyperlipidemia 5.  2 prior bouts of parotitis 6.  Hypothyroidism 7.  Depression 8.  Allergy to aspirin: description sounds like anaphylaxis.  9.  Diastolic CHF: echo (7/10) EF 50-55%, mid to apical anteroseptal hypokinesis, diastolic dysfunction, PASP 33 mmHg.  10.Osteoarthritis 11. Altzheimers  Past Surgical History: Reviewed history from 07/08/2008 and no changes required. 1987 Hysterectomy 9/01  Anal sphincterostomy and fissurectomy - [facility] 1965   Nasal polypectomy           Cataracts 8/99   DEXA 1/01   Hemorrhoid ligation 3/06   MI/3 stents 3/09  Infected salivary gland on left  Family History: Reviewed history from 02/08/2007 and no changes required. Dad died @75  MI No cancer  Social History: Reviewed history from 03/20/2009 and no changes required. Divorced then remarried--widowed 10/02.  5 daughters.  Never Smoked Alcohol use-yes--occ Lived in [place], [place] but has moved to [place].   Review of Systems       sleeping well appetite is good  Physical Exam  General:  alert.  NAD Psych:  normally interactive, good eye contact, not anxious appearing, and not depressed appearing.     Impression & Recommendations:  Problem # 1:  ALZHEIMERS DISEASE (ICD-331.0) Assessment Improved some improvement able to focus and keep schedule better will continue early abd symptoms but better now. WIll hold off on increasing dose for now  Complete Medication List: 1)  Synthroid 75 Mcg Tabs (Levothyroxine sodium) .Marland Kitchen.. 1 by mouth daily 2)  Plavix 75 Mg Tabs (Clopidogrel bisulfate) .... Take 1 tablet by mouth once a day 3)  Fluoxetine Hcl 20 Mg Caps (Fluoxetine hcl) .... Take 1 tablet by mouth once a day 4)  Carvedilol 12.5 Mg Tabs (Carvedilol) .... 1/2 two times a day 5)  Furosemide 20 Mg Tabs (Furosemide) .... Take one tablet by mouth every other day 6)  Potassium Chloride Cr 10 Meq Cr-caps (Potassium chloride) .... Take one tablet by mouth every other day with furosemide 7)  Calcium 600 1500 Mg Tabs (Calcium carbonate) .Marland Kitchen.. 1 by mouth daily 8)  Meclizine Hcl 25 Mg Tabs (Meclizine hcl) .... 1/2-1 three times a day as needed for dizziness 9)  Astelin 137 Mcg/spray Soln (Azelastine hcl) .Marland Kitchen.. 1 spray in each nostril two times a day as needed for allergy symptoms 10)  Donepezil Hcl 5 Mg Tabs (Donepezil hcl) .Marland Kitchen.. 1 tab daily for memory loss 11)  Lorazepam 0.5 Mg Tabs (Lorazepam) .... 1/2 by mouth two times a day as needed for  nerves  Patient Instructions: 1)  Please schedule a follow-up appointment in 4 months .    Orders Added: 1)  Est. Patient Level III [[address]]    Current Allergies (reviewed today): ! ASPIRIN (ASPIRIN)

## 2010-10-22 ENCOUNTER — Telehealth: Payer: Self-pay | Admitting: Internal Medicine

## 2010-10-28 NOTE — Progress Notes (Signed)
Summary:  LORAZEPAM  Phone Note [facility] Request Message [phone] on October 22, 2010 3:20 PM  Refills Requested: Medication #1:  LORAZEPAM 0.5 MG TABS 1/2 by mouth two times a day as needed for nerves.   Last Refilled: 12/22/2009 Form on your desk    Method Requested: Fax to Local Pharmacy Initial call taken by: DeShannon Smith CMA [facility]),  October 22, 2010 3:20 PM  Follow-up for Phone Call        see change in dose  okay #30 x 0 Follow-up by: Cindee Salt MD,  October 23, 2010 7:54 AM  Additional Follow-up for Phone Call Additional follow up Details #1::        Rx called to pharmacy, with new dose Additional Follow-up by: Mervin Hack CMA [facility]),  October 23, 2010 8:36 AM    Prescriptions: LORAZEPAM 0.5 MG TABS (LORAZEPAM) 1/2 by mouth two times a day as needed for nerves  #30 x 0   Entered by:   Mervin Hack CMA (AAMA)   Authorized by:   Cindee Salt MD   Signed by:   Mervin Hack CMA (AAMA) on 10/23/2010   Method used:   Telephoned to ...       [facility]  [address] [id number] ([facility]ail[facility]       [address]       [place], [place]  [address]       Ph: [phone]       Fax: [phone]   RxID:   [id number]

## 2010-11-10 ENCOUNTER — Encounter: Payer: Self-pay | Admitting: Internal Medicine

## 2010-11-11 ENCOUNTER — Telehealth: Payer: Self-pay | Admitting: Family Medicine

## 2010-11-17 NOTE — Progress Notes (Signed)
Summary: refill request for lorazepam  Phone Note Refill Request Message from:  Fax from Pharmacy  Refills Requested: Medication #1:  LORAZEPAM 0.5 MG TABS 1/2 by mouth two times a day as needed for nerves.   Last Refilled: 10/23/2010 Faxed request from [facility], [phone].  Initial call taken by: Lowella Petties CMA, AAMA,  November 11, 2010 2:32 PM  Follow-up for Phone Call        ok to refill.  somewhat early.  please ensure pt taking only 1/2 pill two times a day as needed  Follow-up by: Eustaquio Boyden  MD,  November 11, 2010 4:32 PM  Additional Follow-up for Phone Call Additional follow up Details #1::        Rx called in as directed. Spoke with patient. She said she isn't driving much anymore and was trying to get all of meds filled at the same time to avoid numerous trips to the pharmacy. She is only taking 1/2 of a pill two times a day as needed. She said she understood if it couldnt be filled early. I advised I just wanted to double check the way she was taking it and that it would be sent in for her.  Additional Follow-up by: [name] CMA [facility]),  November 12, 2010 9:20 AM    Additional Follow-up for Phone Call Additional follow up Details #2::    ok to refill. thanks. Follow-up by: Eustaquio Boyden  MD,  November 12, 2010 1:50 PM  Prescriptions: LORAZEPAM 0.5 MG TABS (LORAZEPAM) 1/2 by mouth two times a day as needed for nerves  #30 x 0   Entered by:   [name] CMA (AAMA)   Authorized by:   Eustaquio Boyden  MD   Signed by:   [name] CMA (AAMA) on 11/12/2010   Method used:   Telephoned to ...       [facility]  [address] [id number]       [address]       [place], [place]  [address]       Ph: [phone]       Fax: [phone]   RxID:   [id number]

## 2010-12-28 ENCOUNTER — Ambulatory Visit (INDEPENDENT_AMBULATORY_CARE_PROVIDER_SITE_OTHER): Payer: Medicare Other | Admitting: Internal Medicine

## 2010-12-28 ENCOUNTER — Encounter: Payer: Self-pay | Admitting: Internal Medicine

## 2010-12-28 ENCOUNTER — Other Ambulatory Visit: Payer: Self-pay | Admitting: Cardiology

## 2010-12-28 VITALS — BP 130/69 | HR 57 | Temp 98.6°F | Ht 65.0 in | Wt 172.0 lb

## 2010-12-28 DIAGNOSIS — I2510 Atherosclerotic heart disease of native coronary artery without angina pectoris: Secondary | ICD-10-CM

## 2010-12-28 DIAGNOSIS — G309 Alzheimer's disease, unspecified: Secondary | ICD-10-CM

## 2010-12-28 DIAGNOSIS — M069 Rheumatoid arthritis, unspecified: Secondary | ICD-10-CM

## 2010-12-28 DIAGNOSIS — I10 Essential (primary) hypertension: Secondary | ICD-10-CM

## 2010-12-28 DIAGNOSIS — I509 Heart failure, unspecified: Secondary | ICD-10-CM

## 2010-12-28 DIAGNOSIS — I5032 Chronic diastolic (congestive) heart failure: Secondary | ICD-10-CM

## 2010-12-28 DIAGNOSIS — F329 Major depressive disorder, single episode, unspecified: Secondary | ICD-10-CM

## 2010-12-28 DIAGNOSIS — F0280 Dementia in other diseases classified elsewhere without behavioral disturbance: Secondary | ICD-10-CM

## 2010-12-28 MED ORDER — DONEPEZIL HCL 10 MG PO TABS: 10.0000 mg | ORAL_TABLET | Freq: Every day | ORAL | Status: DC

## 2010-12-28 NOTE — Patient Instructions (Signed)
Please increase the memory pill (donepezil) to 10mg  daily. You can take 2 of the 5mg  tabs till they run out---then start the 10mg  at one a day

## 2010-12-28 NOTE — Progress Notes (Signed)
Subjective:    Patient ID: Leslie Curry, female    DOB: Jan 13, 1931, 77 y.o.   MRN: [id number]  HPI Here with daughter Still forgets things--daughter feels she has slipped and worsened Having off the cuff conversations and poor recall of what has been discussed Seems to have confabulated at times----caused family political problems No apparent persistent mood issues Continues to live alone--- pays bills with supervision (mostly on automatic draft) Still drives to grocery or elswhere---family or housekeeper or friends drive most of the time though Manages ADLs and instrumental ADLs  No stomach trouble on the medication  Heart seems fine Occ chest pain--hasn't needed NTG though No SOB No sig edema  RA continues Now with more leg pain Hands are not as bad Does feel legs are "more tired" than painful  Current outpatient prescriptions:azelastine (ASTELIN) 137 MCG/SPRAY nasal spray, Use one spray in each nostril two times a day as needed for allergy symptoms , Disp: , Rfl: ;  Calcium Carbonate (CALCIUM 600) 1500 MG TABS, Take by mouth daily.  , Disp: , Rfl: ;  carvedilol (COREG) 12.5 MG tablet, Take 1/2 two times a day , Disp: , Rfl: ;  clopidogrel (PLAVIX) 75 MG tablet, Take 75 mg by mouth daily.  , Disp: , Rfl:  donepezil (ARICEPT) 5 MG tablet, Take one tablet daily for memory loss , Disp: , Rfl: ;  FLUoxetine (PROZAC) 20 MG capsule, Take 20 mg by mouth daily.  , Disp: , Rfl: ;  furosemide (LASIX) 20 MG tablet, Take one tablet by mouth every other day , Disp: , Rfl: ;  levothyroxine (SYNTHROID) 75 MCG tablet, Take 75 mcg by mouth daily.  , Disp: , Rfl:  LORazepam (ATIVAN) 0.5 MG tablet, Take 1/2 by mouth two times a day as needed for nerves , Disp: , Rfl: ;  meclizine (ANTIVERT) 25 MG tablet, Take 1/2 to 1 by mouth three times a day as needed for dizziness , Disp: , Rfl: ;  potassium chloride (MICRO-K) 10 MEQ CR capsule, Take one daily buy mouth every other day with Furosemide , Disp: ,  Rfl:   Past Medical History  Diagnosis Date  . CAD (coronary artery disease) 2006    Anterior MI in [place] in 2006. 2 stents in LAD, 1 stent in PDA. Adenosine myoview  (7/10): EF 60%, septal apical infarct. Low risk study. Adenosine myoview (4/11): EF > 55%, mid anteroseptal,  apical septal, and true apex infarct with no ischemia. Low risk study.  . Hypertension   . Parotitis     2 prior bouts  . Hypothyroidism   . Depression   . Allergic to aspirin     description sounds like anaphylaxis  . Diastolic CHF, acute     echo (3/08) EF 50-55%, mid to apical anteroseptal  hypokinesis, diatolic dysfunction, PASP 33 mm/Hg  . Osteoarthritis   . Alzheimer's disease   . Rheumatoid arthritis 2011    diagnosis in question  . Cataracts, bilateral   . Salivary gland infection 03/09    gland on left    Past Surgical History  Procedure Date  . Anal sphincterotomy 09/01    and Fissurectomy- [facility]  . Nasal polyp surgery 1965  . Hemorrhoid surgery 01/01    ligation  . Coronary stent placement 03/06    MI/3    Family History  Problem Relation Age of Onset  . Heart disease Father     MI    History   Social History  . Marital  Status: Married    Spouse Name: N/A    Number of Children: 5  . Years of Education: N/A   Occupational History  .     Social History Main Topics  . Smoking status: Never Smoker   . Smokeless tobacco: Not on file  . Alcohol Use: Yes     Occassionally  . Drug Use: Not on file  . Sexually Active: Not on file   Other Topics Concern  . Not on file   Social History Narrative   Divorced then remarried-widowed 10/02. 5 daughters. Lived in [place], [place] but has moved to [place].   Review of Systems Sleeps well Appetite is fine Weight up ~2#    Objective:   Physical Exam  Constitutional: She appears well-developed and well-nourished. No distress.  Neck: Normal range of motion. Neck supple. No thyromegaly present.  Cardiovascular: Normal rate,  regular rhythm and intact distal pulses.  Exam reveals no gallop.   No murmur heard. Pulmonary/Chest: Effort normal and breath sounds normal. No respiratory distress. She has no wheezes. She has no rales.  Musculoskeletal: She exhibits no edema and no tenderness.       Same swelling and ulnar deviation in hands but no major tenderness  Lymphadenopathy:    She has no cervical adenopathy.  Psychiatric: Her behavior is normal. Judgment and thought content normal.       Slightly defensive but mood otherwise neutral          Assessment & Plan:

## 2010-12-29 LAB — CBC WITH DIFFERENTIAL/PLATELET
HCT: 41.4 % (ref 36.0–46.0)
Monocytes Relative: 6.7 % (ref 3.0–12.0)
Neutro Abs: 3.8 10*3/uL (ref 1.4–7.7)
Neutrophils Relative %: 53.5 % (ref 43.0–77.0)
Platelets: 234 10*3/uL (ref 150.0–400.0)
RBC: 4.46 Mil/uL (ref 3.87–5.11)
RDW: 12.2 % (ref 11.5–14.6)

## 2010-12-29 LAB — BASIC METABOLIC PANEL
CO2: 24 mEq/L (ref 19–32)
GFR: 83.33 mL/min (ref >60.00)
Potassium: 4.5 mEq/L (ref 3.5–5.1)
Sodium: 140 mEq/L (ref 135–145)

## 2010-12-29 LAB — HEPATIC FUNCTION PANEL
ALT: 19 U/L (ref 0–35)
Alkaline Phosphatase: 72 U/L (ref 39–117)
Total Protein: 7.1 g/dL (ref 6.0–8.3)

## 2011-01-07 ENCOUNTER — Encounter: Payer: Self-pay | Admitting: *Deleted

## 2011-01-19 NOTE — Assessment & Plan Note (Signed)
[facility] OFFICE NOTE   NAME:Leslie Curry, Leslie Curry                      MRN:          [id number]  DATE:08/16/2007                            DOB:          Feb 02, 1931    PRIMARY CARE PHYSICIAN:  Dr. Tillman Abide.   REASON FOR CONSULTATION:  Evaluate patient with coronary disease.   HISTORY OF PRESENT ILLNESS:  The patient is a pleasant 77 year old white  female with a history of coronary disease dating back to 2006.  At that  time she had an anterior myocardial infarction.  She had 2 stents placed  to the left anterior descending, apparently bifurcating.  She had a  followup catheterization a couple of days later and had elective  stenting of an 80% posterior descending artery lesion.  She has been  followed in [place] for all of this time, and she has a house there, and  also a house in [place].  Her last study was a stress perfusion  study in early September with an ejection fraction of 73% and no  evidence of ischemia or infarct.   She remains active.  She walks up and down stairs multiple times a day.  She walks a golf course for exercise, walking her dogs twice a day.  She  does not get chest discomfort, neck or arm discomfort with this.  She  has never had any of the symptoms which were fatigue, chest discomfort,  and a decreased exercise tolerance that prompted her to have a cardiac  evaluation in the first place.  She does not have any palpitations,  presyncope, or syncope.   She did have one episode about two weeks ago while folding sheets for  one of her beds.  She felt a little chest heaviness.  This was mild.  This was the first time she took a sublingual nitroglycerin since she  has had them.  She said she felt better after the first one but took one  more.  She took it easy the rest of the day and had no recurrent  symptoms.  She has been fine and doing her usual activities over the  past 2  weeks without any recurrent symptoms.   PAST MEDICAL HISTORY:  1. Hypertension, well controlled.  2. Rheumatoid arthritis.   PAST SURGICAL HISTORY:  1. Hysterectomy.  2. Anal sphincterotomy.  3. Nasal polypectomy.  4. Cataracts.   ALLERGIES:  ASPIRIN.   MEDICATIONS:  1. Methotrexate.  2. Levothyroxine 75 mcg daily.  3. Folic acid 1 mg daily.  4. Calcium 600 mg daily.  5. Tricor daily.  6. Carvedilol 6.25 mg b.i.d.  7. Ambien 5 mg daily.  8. Simvastatin 40 mg daily.  9. Plavix 75 mg daily.  10.Lorazepam.  11.Fluoxetine.  12.Doxycycline.   SOCIAL HISTORY:  The patient works in the garden.  She is a widow.  She  has 5 daughters.  She is very active.   FAMILY HISTORY:  Noncontributory for early coronary disease, though her  father died, she is not sure of his age, but thinks he was older.  He  did have a myocardial infarction.  She had a sister who died at 28 of  unknown causes.   REVIEW OF SYSTEMS:  As stated in the HPI and positive for diffuse joint  pains which are somewhat limiting.   PHYSICAL EXAMINATION:  The patient is well appearing and looks younger  than her stated age.  Blood pressure 118/70, heart rate 69 and regular, weight 162 pounds,  body mass index 26.5.  HEENT:  Eyelids unremarkable, pupils are equal, round, and reactive to  light, fundi not visualized.  Oral mucosa unremarkable.  NECK:  No jugular venous distension, wave form within normal limits,  carotid upstroke brisk and symmetrical.  No bruit.  No thyromegaly.  LYMPHATICS:  No cervical, axillary, inguinal adenopathy.  LUNGS:  Clear to auscultation bilaterally.  BACK:  No costovertebral angle tenderness.  CHEST:  Unremarkable.  HEART:  PMI not displaced or sustained.  S1 and S2 are within normal  limits.  No S3, no S4.  No clicks, no rubs, no murmurs.  ABDOMEN:  Flat, positive bowel sounds, normal in frequency and pitch.  No bruits, no rebound, no guarding.  No midline pulsatile mass.  No   hepatomegaly, no splenomegaly.  SKIN:  No rashes, no nodules.  EXTREMITIES:  2+ pulses throughout, no edema.  No cyanosis, no clubbing.  NEURO:  Oriented to person, place, and time.  Cranial nerves II-XII  grossly intact, motor grossly intact throughout.   EKG sinus rhythm, rate 69, axis within normal limits, intervals within  normal limits, no acute ST-T wave changes.   ASSESSMENT AND PLAN:  1. Coronary disease:  The patient has coronary disease as described.      She has been doing well for the last 2 years since her myocardial      infarction.  She had a stress perfusion study 3 months ago.  She      has had 1 episode of chest discomfort 2 weeks ago.  Since then she      has been active and exerting herself, and not bringing on any      symptoms.  At this point, I will not order repeat stress perfusion      study based on this 1 symptom without objective evidence of      ischemia.  However, I have a low threshold should she have any      recurrent symptoms.  She is very attuned to herself, and will let      me know if she has any problems that could, even remotely, be      cardiac.  Otherwise, she will continue the medications as listed.      Of note, she is intolerant of ASPIRIN, so she will remain on      Plavix.  2. Dyslipidemia:  I will try to get the most recent lipid profile from      Dr. Alphonsus Sias.  The goal will be an LDL less than 100 and HDL greater      than 50.  3. Hypertension:  Blood pressure is well controlled and she will      continue the medications as listed.  4. Rheumatoid arthritis:  She remains on her methotrexate, but despite      this significant disease, she has no severe limitations.  5. Followup:  I will see the patient again in 2 months or sooner if      she has any symptoms at all.     Rollene Rotunda,  MD, [facility]  Electronically Signed    JH/MedQ  DD: 08/16/2007  DT: 08/16/2007  Job #: [id number]   cc:   Karie Schwalbe, MD

## 2011-01-21 ENCOUNTER — Encounter: Payer: Self-pay | Admitting: Family Medicine

## 2011-01-21 ENCOUNTER — Ambulatory Visit (INDEPENDENT_AMBULATORY_CARE_PROVIDER_SITE_OTHER): Payer: Medicare Other | Admitting: Family Medicine

## 2011-01-21 DIAGNOSIS — J40 Bronchitis, not specified as acute or chronic: Secondary | ICD-10-CM | POA: Insufficient documentation

## 2011-01-21 DIAGNOSIS — H109 Unspecified conjunctivitis: Secondary | ICD-10-CM | POA: Insufficient documentation

## 2011-01-21 DIAGNOSIS — H5789 Other specified disorders of eye and adnexa: Secondary | ICD-10-CM

## 2011-01-21 MED ORDER — POLYMYXIN B-TRIMETHOPRIM 10000-0.1 UNIT/ML-% OP SOLN: 1.0000 [drp] | OPHTHALMIC | Status: AC

## 2011-01-21 MED ORDER — AZITHROMYCIN 250 MG PO TABS: ORAL_TABLET | ORAL | Status: AC

## 2011-01-21 NOTE — Assessment & Plan Note (Signed)
Treat aggressively with zpack.  Pt very worried about being ill. Continue supportive care

## 2011-01-21 NOTE — Assessment & Plan Note (Signed)
Likely pink eye however does have significant component of green/yellow discharge bilaterally.   Warm compresses, sent in abx eye drops in case not improving as expected. Snellen ok.

## 2011-01-21 NOTE — Patient Instructions (Addendum)
I think you have bronchitis as well as conjunctivitis.  Treat with azithromycin as well as eye drops.  Use warm water compresses to eyes three times a day for next few days. Push fluids and plenty of rest for next several days. Update [place] if fever >101, worsening cough or trouble breathing. Follow up with Dr. Alphonsus Sias in next few weeks to see how aricept medicine is working.

## 2011-01-21 NOTE — Progress Notes (Signed)
  Subjective:    Patient ID: Leslie Curry, female    DOB: Jan 18, 1931, 77 y.o.   MRN: [id number]  HPI CC: don't feel well  New patient to me presents as acute visit, also per her for "routine checkup".  However I have never seen her so I asked her to reschedule checkup with Dr. Alphonsus Sias.  Acute visit: 2-3 d history of bad cough.  Productive of yellow mucous.  + ST.  + HA.  + bilateral eyes red and filled with mucous this am - scared her.  No pain with eye movements.  Some blurry vision.  Hasn't tried anything so far.  No fevers/chills, abd pain, n/v/d, rashes, arthralgias.  No SOB, wheeze.  No ear pain or tooth pain.  + myalgias legs.  No sick contacts at home, no smokers at home.  + h/o asthma and allergies.  Very stressed with family, wonders if this led her to get sick easier.  Review of Systems Per HPI    Objective:   Physical Exam  Nursing note and vitals reviewed. Constitutional: She appears well-developed and well-nourished. No distress.       Evidently congested and coughing.  HENT:  Head: Normocephalic and atraumatic.  Right Ear: External ear normal.  Left Ear: External ear normal.  Nose: No mucosal edema or rhinorrhea. Right sinus exhibits no maxillary sinus tenderness and no frontal sinus tenderness. Left sinus exhibits no maxillary sinus tenderness and no frontal sinus tenderness.  Mouth/Throat: Uvula is midline, oropharynx is clear and moist and mucous membranes are normal. No oropharyngeal exudate.  Eyes: EOM are normal. Pupils are equal, round, and reactive to light. Right eye exhibits discharge. Left eye exhibits discharge. Right conjunctiva is injected. Left conjunctiva is injected. No scleral icterus. Right eye exhibits normal extraocular motion and no nystagmus. Left eye exhibits normal extraocular motion and no nystagmus.       Thin green discharge from bilateral eyes. No pain with eye movements. Conjunctiva (bulbar and palpebral) injected throughout   Neck:  Normal range of motion. Neck supple. No JVD present. No thyromegaly present.  Cardiovascular: Normal rate, regular rhythm, normal heart sounds and intact distal pulses.   No murmur heard. Pulmonary/Chest: Effort normal and breath sounds normal. No respiratory distress. She has no wheezes. She has no rales.  Lymphadenopathy:    She has no cervical adenopathy.  Skin: Skin is warm and dry. No rash noted.          Assessment & Plan:

## 2011-02-05 ENCOUNTER — Other Ambulatory Visit: Payer: Self-pay | Admitting: *Deleted

## 2011-02-05 MED ORDER — CARVEDILOL 12.5 MG PO TABS: ORAL_TABLET | ORAL | Status: DC

## 2011-02-10 ENCOUNTER — Other Ambulatory Visit: Payer: Self-pay | Admitting: *Deleted

## 2011-02-10 MED ORDER — CLOPIDOGREL BISULFATE 75 MG PO TABS: 75.0000 mg | ORAL_TABLET | Freq: Every day | ORAL | Status: DC

## 2011-03-17 ENCOUNTER — Other Ambulatory Visit: Payer: Self-pay | Admitting: Cardiology

## 2011-03-18 MED ORDER — POTASSIUM CHLORIDE ER 10 MEQ PO CPCR: ORAL_CAPSULE | ORAL | Status: DC

## 2011-03-19 ENCOUNTER — Telehealth: Payer: Self-pay | Admitting: *Deleted

## 2011-03-19 NOTE — Telephone Encounter (Signed)
[facility] to let you know that pt is non compliant with her meds.  Pt told the pharmacist that she's not taking plavix, not taking carvidilol because it's too hard for her to swallow. Pharmacist suggests that instead of pt taking half of a 12.5 mg twice a day maybe the patient can take one 6.25 mg instead

## 2011-03-23 NOTE — Telephone Encounter (Signed)
Dr. Alphonsus Sias, did you see this note?

## 2011-03-23 NOTE — Telephone Encounter (Signed)
Has appt next week Will review at that time May be related to her dementia and lack of insight

## 2011-03-26 ENCOUNTER — Emergency Department: Payer: Medicare Other | Admitting: Emergency Medicine

## 2011-03-29 ENCOUNTER — Other Ambulatory Visit: Payer: Self-pay | Admitting: *Deleted

## 2011-03-29 MED ORDER — TRAMADOL HCL 50 MG PO TABS: ORAL_TABLET | ORAL | Status: DC

## 2011-03-29 NOTE — Telephone Encounter (Signed)
Fax is on your desk . 

## 2011-03-29 NOTE — Telephone Encounter (Signed)
rx sent to pharmacy by e-script .left message to have patient return my call.  

## 2011-03-29 NOTE — Telephone Encounter (Signed)
Okay to refill #15 x 0 Can go electronic Find out is she needs appt or is having a new problem

## 2011-03-30 ENCOUNTER — Ambulatory Visit: Payer: Medicare Other | Admitting: Internal Medicine

## 2011-05-19 ENCOUNTER — Other Ambulatory Visit: Payer: Self-pay | Admitting: Internal Medicine

## 2011-05-21 ENCOUNTER — Other Ambulatory Visit: Payer: Self-pay | Admitting: *Deleted

## 2011-05-21 MED ORDER — TRAMADOL HCL 50 MG PO TABS: 50.0000 mg | ORAL_TABLET | Freq: Three times a day (TID) | ORAL | Status: DC | PRN

## 2011-05-21 NOTE — Telephone Encounter (Signed)
Pt states she needs a refill on tramadol, still having hip pain from falling in July.  [facility]

## 2011-05-21 NOTE — Telephone Encounter (Signed)
Please let her know Rx sent She should use this as sparingly as possible

## 2011-05-25 ENCOUNTER — Ambulatory Visit: Payer: Medicare Other | Admitting: Internal Medicine

## 2011-05-26 ENCOUNTER — Ambulatory Visit: Payer: Medicare Other | Admitting: Internal Medicine

## 2011-05-26 DIAGNOSIS — Z0289 Encounter for other administrative examinations: Secondary | ICD-10-CM

## 2011-06-01 IMAGING — CR DG CHEST 1V PORT
1 series · 1 of 1 positions shown · non-contrast
Comparison: none

REASON FOR EXAM: chest pain
COMMENTS:

[view not recorded]
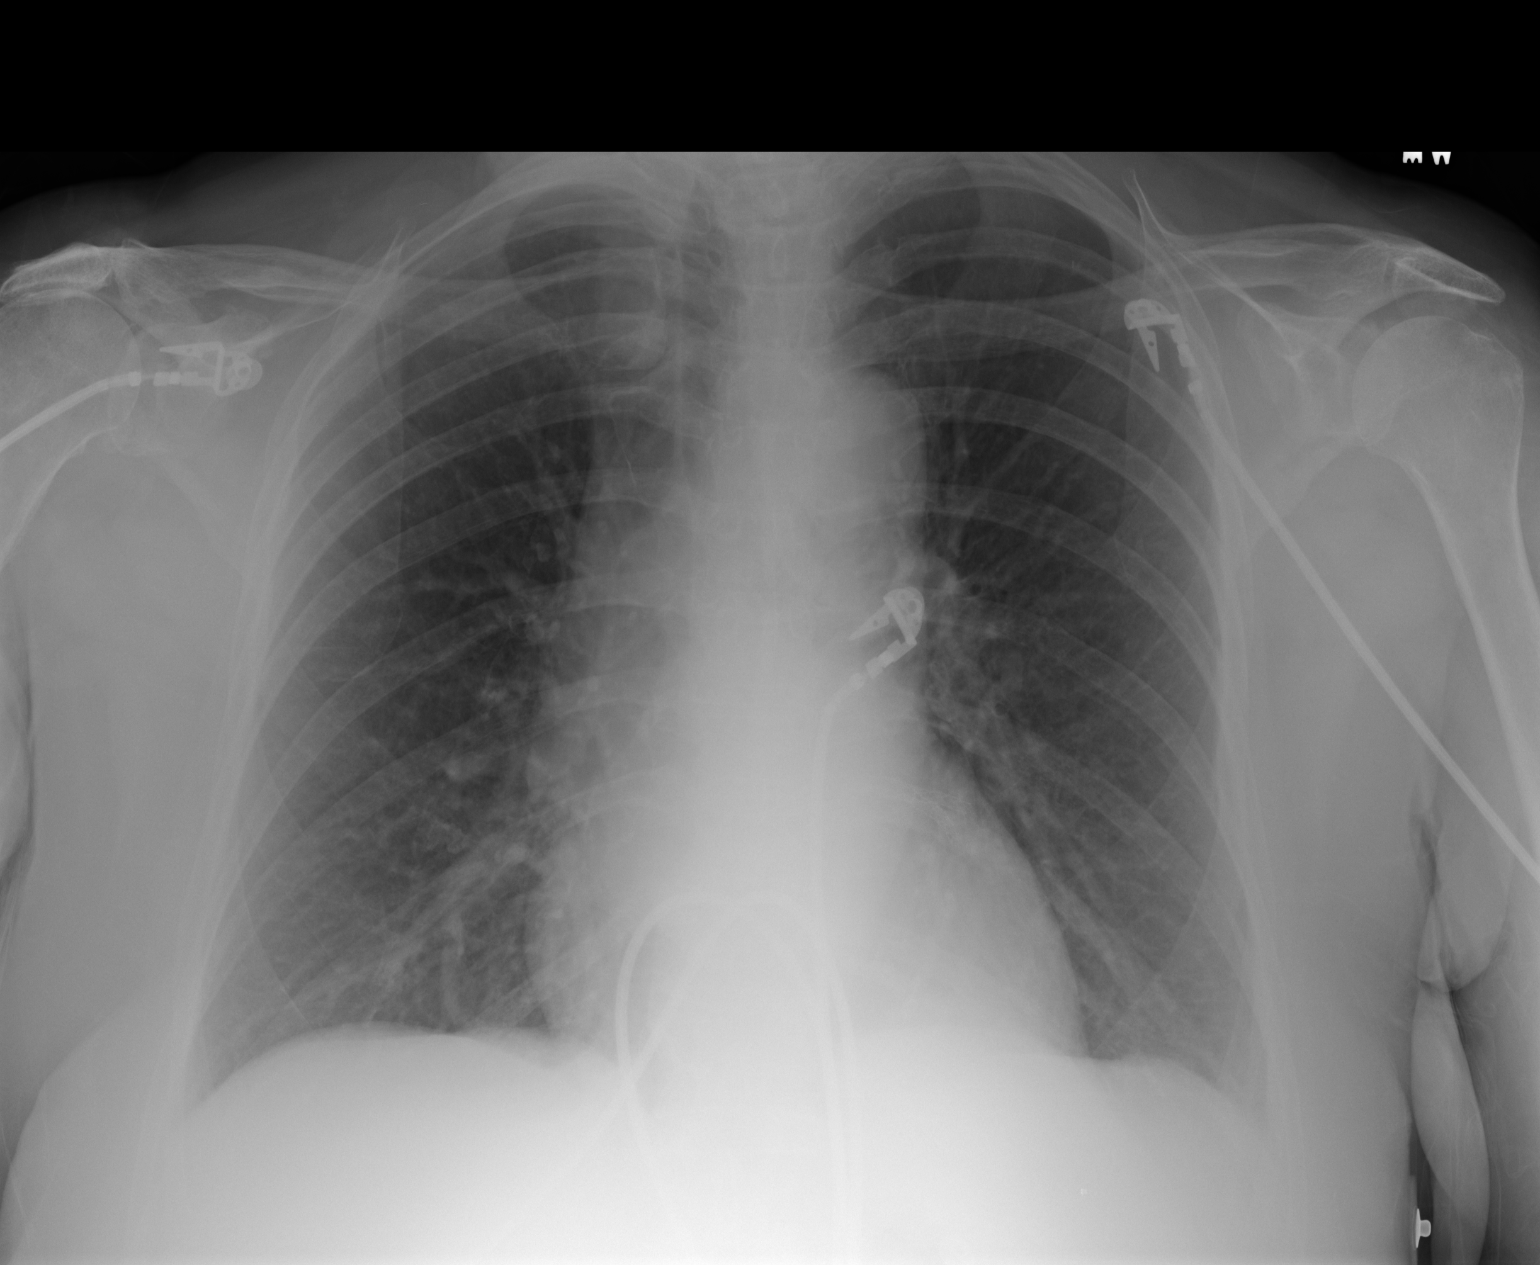

[1 of 1 positions shown; findings below may reference images not displayed]

PROCEDURE:     DXR - DXR PORTABLE CHEST SINGLE VIEW  - December 17, 2009  [DATE]

RESULT:     Comparison is made to the study of 05/19/2009.

Cardiac monitoring electrodes are present. The lungs are hyperinflated
consistent with COPD. There is no edema, infiltrate, effusion or
pneumothorax. The cardiac silhouette appears to be within normal limits.
IMPRESSION: COPD.

## 2011-06-07 ENCOUNTER — Other Ambulatory Visit: Payer: Self-pay | Admitting: *Deleted

## 2011-06-07 MED ORDER — CARVEDILOL 12.5 MG PO TABS: 6.2500 mg | ORAL_TABLET | Freq: Two times a day (BID) | ORAL | Status: DC

## 2011-06-22 ENCOUNTER — Other Ambulatory Visit: Payer: Self-pay | Admitting: *Deleted

## 2011-06-22 NOTE — Telephone Encounter (Signed)
Pt was prescribed aricept a year ago but pt has not been taking.  She may have gotten it filled the first time but has had any since.  Daughter states pt is getting bad about forgetting to take her meds. She is forgetting a lot of things.  She is asking if pt can get a new script for aricept and will hopefully take it.  [facility]  Do you want to see the patient to follow up?  Please advise.

## 2011-06-22 NOTE — Telephone Encounter (Signed)
Okay to send Rx for 5mg  daily #30 x 11 Set up appt for about 1 month if she doesn't already have one

## 2011-06-23 NOTE — Telephone Encounter (Signed)
Patient already had refills on the 5 mg dose, per pharmacy she have 7 refills left, they will refill. I left message on daughter's VM advising refills and to schedule an appt in a month

## 2011-07-09 ENCOUNTER — Telehealth: Payer: Self-pay | Admitting: *Deleted

## 2011-07-09 MED ORDER — HYDROCODONE-ACETAMINOPHEN 5-325 MG PO TABS: 1.0000 | ORAL_TABLET | ORAL | Status: DC | PRN

## 2011-07-09 NOTE — Telephone Encounter (Signed)
rx sent to pharmacy by e-script Spoke with patient and advised results appt scheduled 

## 2011-07-09 NOTE — Telephone Encounter (Signed)
Pt c/o of a fall a while back it didn't bother her to much then but she now has severe hip pain. She is scheduled to see Dr Lavenia Atlas on Monday, but is requesting pain med to last her until then. She says pain is so bad she is having difficulty getting up to go to restroom  and she lives by herself. She states she didn't see you for the fall, but she has seen you since and mentioned it previously.

## 2011-07-09 NOTE — Telephone Encounter (Signed)
She actually no showed for my last appt and hasn't been in for a while She needs to reschedule and come in with her daughter  Molli Knock hydrocodone/acet 5/325 1 every 4 hours prn #15 x 0 pending eval by Dr Gavin Potters

## 2011-07-12 ENCOUNTER — Encounter: Payer: Self-pay | Admitting: Internal Medicine

## 2011-07-12 ENCOUNTER — Ambulatory Visit (INDEPENDENT_AMBULATORY_CARE_PROVIDER_SITE_OTHER): Payer: Medicare Other | Admitting: Internal Medicine

## 2011-07-12 VITALS — BP 150/70 | HR 66 | Temp 98.3°F | Ht 65.0 in | Wt 173.0 lb

## 2011-07-12 DIAGNOSIS — F0280 Dementia in other diseases classified elsewhere without behavioral disturbance: Secondary | ICD-10-CM

## 2011-07-12 DIAGNOSIS — F329 Major depressive disorder, single episode, unspecified: Secondary | ICD-10-CM

## 2011-07-12 DIAGNOSIS — I2510 Atherosclerotic heart disease of native coronary artery without angina pectoris: Secondary | ICD-10-CM

## 2011-07-12 DIAGNOSIS — M069 Rheumatoid arthritis, unspecified: Secondary | ICD-10-CM

## 2011-07-12 DIAGNOSIS — G309 Alzheimer's disease, unspecified: Secondary | ICD-10-CM

## 2011-07-12 DIAGNOSIS — Z23 Encounter for immunization: Secondary | ICD-10-CM

## 2011-07-12 MED ORDER — METOPROLOL SUCCINATE ER 25 MG PO TB24: 25.0000 mg | ORAL_TABLET | Freq: Every day | ORAL | Status: DC

## 2011-07-12 NOTE — Assessment & Plan Note (Addendum)
Has had slow progression ??some stability on the aricept Limited insight Needs help with med administration and food prep,etc Advised daughter to sell car Needs daily aide to monitor meds and help with instrumental ADLs  counselled for almost all of 30 minute visit

## 2011-07-12 NOTE — Patient Instructions (Signed)
Please stop carvedilol and substitute metoprolol 25mg  just once a day in the morning Please get an aide for help every day Do not drive any more

## 2011-07-12 NOTE — Progress Notes (Signed)
Subjective:    Patient ID: Leslie Curry, female    DOB: 06-15-1931, 77 y.o.   MRN: [id number]  HPI Here with daughter Leslie Curry--from [place]  Fighting with daughters over driving---I discussed that she should stop immediately Has not been taking meds correctly Not complying at this point--has not been remembering the evening pill at all Daughters plan to get aide in AM to help with meds, drive her as needed, get meals prepared for her  Still independent with ADLs but more trouble with meal prep, etc Needs help with cleaning, etc  Still has some friends Has mixed up days and missed visits with them Still values being home Doesn't feel the depression is worse  Daughter notes that she has not kept up the care of her 2 dogs  Current Outpatient Prescriptions on File Prior to Visit  Medication Sig Dispense Refill  . azelastine (ASTELIN) 137 MCG/SPRAY nasal spray Use one spray in each nostril two times a day as needed for allergy symptoms       . Calcium Carbonate (CALCIUM 600) 1500 MG TABS Take by mouth daily.        . carvedilol (COREG) 12.5 MG tablet Take 0.5 tablets (6.25 mg total) by mouth 2 (two) times daily with a meal.  90 tablet  0  . clopidogrel (PLAVIX) 75 MG tablet Take 1 tablet (75 mg total) by mouth daily.  30 tablet  11  . donepezil (ARICEPT) 10 MG tablet Take 5 mg by mouth daily.        Marland Kitchen FLUoxetine (PROZAC) 20 MG capsule TAKE 1 CAPSULE EVERY DAY  30 capsule  8  . furosemide (LASIX) 20 MG tablet TAKE 1 TABLET BY MOUTH EVERY OTHER DAY  90 tablet  1  . HYDROcodone-acetaminophen (NORCO) 5-325 MG per tablet Take 1 tablet by mouth every 4 (four) hours as needed.  15 tablet  0  . KLOR-CON M10 10 MEQ tablet TAKE 1 TABLET EVERY OTHER DAY WITH FUROSEMIDE  90 tablet  0  . levothyroxine (SYNTHROID) 75 MCG tablet Take 75 mcg by mouth daily.        Marland Kitchen LORazepam (ATIVAN) 0.5 MG tablet Take 1/2 by mouth two times a day as needed for nerves       . meclizine (ANTIVERT) 25 MG tablet Take 1/2  to 1 by mouth three times a day as needed for dizziness       . potassium chloride (MICRO-K) 10 MEQ CR capsule Take one daily buy mouth every other day with Furosemide  30 capsule  3    Allergies  Allergen Reactions  . Aspirin     Past Medical History  Diagnosis Date  . CAD (coronary artery disease) 2006    Anterior MI in [place] in 2006. 2 stents in LAD, 1 stent in PDA. Adenosine myoview  (7/10): EF 60%, septal apical infarct. Low risk study. Adenosine myoview (4/11): EF > 55%, mid anteroseptal,  apical septal, and true apex infarct with no ischemia. Low risk study.  . Hypertension   . Parotitis     2 prior bouts  . Hypothyroidism   . Depression   . Allergic to aspirin     description sounds like anaphylaxis  . Diastolic CHF, acute     echo (4/09) EF 50-55%, mid to apical anteroseptal  hypokinesis, diatolic dysfunction, PASP 33 mm/Hg  . Osteoarthritis   . Alzheimer's disease   . Rheumatoid arthritis 2011    diagnosis in question  . Cataracts, bilateral   .  Salivary gland infection 03/09    gland on left    Past Surgical History  Procedure Date  . Anal sphincterotomy 09/01    and Fissurectomy- [facility]  . Nasal polyp surgery 1965  . Hemorrhoid surgery 01/01    ligation  . Coronary stent placement 03/06    MI/3    Family History  Problem Relation Age of Onset  . Heart disease Father     MI    History   Social History  . Marital Status: Married    Spouse Name: N/A    Number of Children: 5  . Years of Education: N/A   Occupational History  .     Social History Main Topics  . Smoking status: Never Smoker   . Smokeless tobacco: Not on file  . Alcohol Use: Yes     Occassionally  . Drug Use: Not on file  . Sexually Active: Not on file   Other Topics Concern  . Not on file   Social History Narrative   Divorced then remarried-widowed 10/02. 5 daughters. Lived in [place], [place] but has moved to [place].     Review of Systems     Objective:    Physical Exam  Neurological:       October 13th 2012 [facility]  [name], [place], ?? [phone] Recall  0/3  Did well on clock draw--normal  Psychiatric:       Fighting daughter and I Very little insight into her condition          Assessment & Plan:

## 2011-07-12 NOTE — Assessment & Plan Note (Signed)
Mood has been okay except when challenged  Has anger and frustration with limits that need to be set due to dementia

## 2011-07-12 NOTE — Assessment & Plan Note (Signed)
Seems to be doing okay Can't take bid meds Will change coreg to metoprolol succinate

## 2011-07-12 NOTE — Assessment & Plan Note (Signed)
Still sees Dr Gavin Potters Just had right hip injection there too (?osteoarthritis)

## 2011-07-14 ENCOUNTER — Other Ambulatory Visit: Payer: Self-pay | Admitting: Internal Medicine

## 2011-07-14 ENCOUNTER — Ambulatory Visit: Payer: Medicare Other | Admitting: Cardiology

## 2011-07-14 ENCOUNTER — Telehealth: Payer: Self-pay | Admitting: *Deleted

## 2011-07-14 MED ORDER — TRAMADOL HCL 50 MG PO TABS: 50.0000 mg | ORAL_TABLET | Freq: Four times a day (QID) | ORAL | Status: DC | PRN

## 2011-07-14 NOTE — Telephone Encounter (Signed)
Faxed request asking for refill of Tramadol, I know there was some discussion about this yesterday, ok to refill? Last refilled 05/21/2011

## 2011-07-14 NOTE — Telephone Encounter (Signed)
Called and had to leave message.

## 2011-07-14 NOTE — Telephone Encounter (Signed)
Pt is having a major breakdown over something that was said at her recent appt. Stanton Kidney was not at that appt her other sister was and she would like to clarify what was said asap.

## 2011-07-14 NOTE — Telephone Encounter (Signed)
rx sent to pharmacy by e-script  

## 2011-07-14 NOTE — Telephone Encounter (Signed)
Okay to refill #60 x 0

## 2011-07-15 NOTE — Telephone Encounter (Signed)
Message left again The machine is identified so I told her that the main issues were not driving and needing daily help instead of only twice a week If she needs more info, I told her to call again

## 2011-07-15 NOTE — Telephone Encounter (Signed)
Called again and had to leave message again

## 2011-07-26 ENCOUNTER — Other Ambulatory Visit: Payer: Self-pay | Admitting: Cardiology

## 2011-08-10 DIAGNOSIS — Z0289 Encounter for other administrative examinations: Secondary | ICD-10-CM

## 2011-08-11 ENCOUNTER — Telehealth: Payer: Self-pay | Admitting: Internal Medicine

## 2011-08-11 NOTE — Telephone Encounter (Signed)
I left a message on daughter's voice mail to pick up form and there will be a $20 charge.

## 2011-08-11 NOTE — Telephone Encounter (Signed)
Form done $20 charge 

## 2011-08-11 NOTE — Telephone Encounter (Signed)
Form is to give daughter a leave of absence to take care of her mother Leslie Curry.  The reason why she is taking the time off she is because she is having to give her day to day care.  She is not taking her medication and since the Car Keys has been taken away she has decreased in Memory.  She is very upset with her daughter that she can't remember why she just wants to get her stabilized and more balanced.  She stated that she needs a month to spend with her to get her stabilized and maybe get home health to come in.  She is having to take her everywhere to the grocery store to doctor appts. And also to PT.

## 2011-10-15 ENCOUNTER — Other Ambulatory Visit: Payer: Self-pay | Admitting: Internal Medicine

## 2011-10-22 ENCOUNTER — Ambulatory Visit: Payer: Medicare Other | Admitting: Internal Medicine

## 2011-10-29 ENCOUNTER — Ambulatory Visit: Payer: Medicare Other | Admitting: Internal Medicine

## 2011-10-29 DIAGNOSIS — Z0289 Encounter for other administrative examinations: Secondary | ICD-10-CM

## 2011-11-26 ENCOUNTER — Other Ambulatory Visit: Payer: Self-pay | Admitting: Cardiology

## 2011-11-30 ENCOUNTER — Other Ambulatory Visit: Payer: Self-pay | Admitting: Cardiology

## 2012-01-28 ENCOUNTER — Other Ambulatory Visit: Payer: Self-pay | Admitting: *Deleted

## 2012-01-28 MED ORDER — CLOPIDOGREL BISULFATE 75 MG PO TABS: 75.0000 mg | ORAL_TABLET | Freq: Every day | ORAL | Status: DC

## 2012-01-28 MED ORDER — FLUOXETINE HCL 20 MG PO CAPS: 20.0000 mg | ORAL_CAPSULE | Freq: Every day | ORAL | Status: DC

## 2012-01-28 MED ORDER — METOPROLOL SUCCINATE ER 25 MG PO TB24: 25.0000 mg | ORAL_TABLET | Freq: Every day | ORAL | Status: DC

## 2012-02-01 ENCOUNTER — Other Ambulatory Visit: Payer: Self-pay | Admitting: *Deleted

## 2012-02-01 MED ORDER — POTASSIUM CHLORIDE CRYS ER 10 MEQ PO TBCR: 10.0000 meq | EXTENDED_RELEASE_TABLET | Freq: Every day | ORAL | Status: DC

## 2012-02-13 ENCOUNTER — Other Ambulatory Visit: Payer: Self-pay | Admitting: Internal Medicine

## 2012-02-22 ENCOUNTER — Other Ambulatory Visit: Payer: Self-pay | Admitting: *Deleted

## 2012-02-22 ENCOUNTER — Other Ambulatory Visit: Payer: Self-pay | Admitting: Internal Medicine

## 2012-02-22 MED ORDER — CLOPIDOGREL BISULFATE 75 MG PO TABS: 75.0000 mg | ORAL_TABLET | Freq: Every day | ORAL | Status: DC

## 2012-03-20 ENCOUNTER — Other Ambulatory Visit: Payer: Self-pay | Admitting: *Deleted

## 2012-03-20 MED ORDER — DONEPEZIL HCL 10 MG PO TABS: 10.0000 mg | ORAL_TABLET | Freq: Every day | ORAL | Status: DC

## 2012-04-16 ENCOUNTER — Other Ambulatory Visit: Payer: Self-pay | Admitting: Internal Medicine

## 2012-04-23 ENCOUNTER — Other Ambulatory Visit: Payer: Self-pay | Admitting: Internal Medicine

## 2012-04-24 NOTE — Telephone Encounter (Signed)
Ask her to make appt  I am sorry she was unhappy with her appt I have asked for her daughter to come because her memory is not good If she wants to come alone, okay--but someone should drive her  Okay to refill #30x 1 if she sets up appt If she doesn't want to come back here, she really needs to set up with another physician (but I hope she comes back---we have known each other for a long time)

## 2012-04-24 NOTE — Telephone Encounter (Signed)
Patient sch'd appt Wednesday 04/26/12 @ 12:30 rx sent to pharmacy by e-script

## 2012-04-24 NOTE — Telephone Encounter (Signed)
Spoke with patient to have her schedule an appointment last seen 07/2011, I advised Dr.Letvak can't keep refilling her medications without being seen. Patient states she was not happy with her last visit, she states Dr. Alphonsus Sias treated her like a "senile old lady" pt states she doesn't need her daughters at any of her visits she states Dr.Letvak always requests her daughters be present at her appts,and they are not needed and can't make any decisions about her healthcare,  pt states if Dr.Letvak treats her bad again she will switch physicians.

## 2012-04-26 ENCOUNTER — Other Ambulatory Visit: Payer: Self-pay | Admitting: *Deleted

## 2012-04-26 ENCOUNTER — Ambulatory Visit: Payer: Medicare Other | Admitting: Internal Medicine

## 2012-04-26 DIAGNOSIS — Z0289 Encounter for other administrative examinations: Secondary | ICD-10-CM

## 2012-06-07 ENCOUNTER — Encounter: Payer: Self-pay | Admitting: Internal Medicine

## 2012-06-07 ENCOUNTER — Ambulatory Visit (INDEPENDENT_AMBULATORY_CARE_PROVIDER_SITE_OTHER): Payer: Medicare Other | Admitting: Internal Medicine

## 2012-06-07 VITALS — BP 130/80 | HR 74 | Temp 97.8°F | Ht 65.0 in | Wt 176.0 lb

## 2012-06-07 DIAGNOSIS — F0280 Dementia in other diseases classified elsewhere without behavioral disturbance: Secondary | ICD-10-CM

## 2012-06-07 DIAGNOSIS — I5032 Chronic diastolic (congestive) heart failure: Secondary | ICD-10-CM

## 2012-06-07 DIAGNOSIS — Z23 Encounter for immunization: Secondary | ICD-10-CM

## 2012-06-07 DIAGNOSIS — F329 Major depressive disorder, single episode, unspecified: Secondary | ICD-10-CM

## 2012-06-07 DIAGNOSIS — M19049 Primary osteoarthritis, unspecified hand: Secondary | ICD-10-CM

## 2012-06-07 DIAGNOSIS — F3289 Other specified depressive episodes: Secondary | ICD-10-CM

## 2012-06-07 DIAGNOSIS — I10 Essential (primary) hypertension: Secondary | ICD-10-CM

## 2012-06-07 LAB — CBC WITH DIFFERENTIAL/PLATELET
Eosinophils Relative: 1.2 % (ref 0.0–5.0)
HCT: 43 % (ref 36.0–46.0)
Lymphocytes Relative: 27.5 % (ref 12.0–46.0)
Monocytes Relative: 5.8 % (ref 3.0–12.0)
Neutrophils Relative %: 65.4 % (ref 43.0–77.0)
Platelets: 235 10*3/uL (ref 150.0–400.0)
WBC: 9.5 10*3/uL (ref 4.5–10.5)

## 2012-06-07 LAB — HEPATIC FUNCTION PANEL
AST: 21 U/L (ref 0–37)
Alkaline Phosphatase: 65 U/L (ref 39–117)
Bilirubin, Direct: 0.2 mg/dL (ref 0.0–0.3)
Total Bilirubin: 1.8 mg/dL — ABNORMAL HIGH (ref 0.3–1.2)

## 2012-06-07 LAB — LIPID PANEL
HDL: 36.5 mg/dL — ABNORMAL LOW (ref >39.00)
VLDL: 53 mg/dL — ABNORMAL HIGH (ref 0.0–40.0)

## 2012-06-07 LAB — BASIC METABOLIC PANEL
BUN: 12 mg/dL (ref 6–23)
GFR: 81.71 mL/min (ref >60.00)
Potassium: 4.3 mEq/L (ref 3.5–5.1)

## 2012-06-07 LAB — LDL CHOLESTEROL, DIRECT: Direct LDL: 145.3 mg/dL

## 2012-06-07 LAB — TSH: TSH: 2.42 u[IU]/mL (ref 0.35–5.50)

## 2012-06-07 MED ORDER — FUROSEMIDE 20 MG PO TABS: 20.0000 mg | ORAL_TABLET | ORAL | Status: DC

## 2012-06-07 MED ORDER — CLOPIDOGREL BISULFATE 75 MG PO TABS: 75.0000 mg | ORAL_TABLET | Freq: Every day | ORAL | Status: DC

## 2012-06-07 MED ORDER — AZELASTINE HCL 0.1 % NA SOLN: 1.0000 | Freq: Two times a day (BID) | NASAL | Status: AC | PRN

## 2012-06-07 MED ORDER — DONEPEZIL HCL 10 MG PO TABS: 10.0000 mg | ORAL_TABLET | Freq: Every day | ORAL | Status: DC

## 2012-06-07 MED ORDER — FLUOXETINE HCL 20 MG PO CAPS: 20.0000 mg | ORAL_CAPSULE | ORAL | Status: DC

## 2012-06-07 MED ORDER — METOPROLOL SUCCINATE ER 25 MG PO TB24: 25.0000 mg | ORAL_TABLET | Freq: Every day | ORAL | Status: DC

## 2012-06-07 MED ORDER — LEVOTHYROXINE SODIUM 75 MCG PO TABS: 75.0000 ug | ORAL_TABLET | Freq: Every day | ORAL | Status: DC

## 2012-06-07 NOTE — Assessment & Plan Note (Signed)
Still seems to be in mild phase Independent with ADLs and many instrumental ADLs Living alone with help Continue the donepezil

## 2012-06-07 NOTE — Progress Notes (Signed)
Subjective:    Patient ID: Leslie Curry, female    DOB: [birth year], 78 y.o.   MRN: [id number]  HPI Here with daughter--Deborah Did stop driving--"I decided to stop myself"  Daughter fixes med tray---needs direct reminders (daughter goes over at least 5 days per week) No bottles in house now Has aide/house cleaner who buys food for her (already prepared) Some friends cook for her She does put dishes in dishwasher and does laundry for the most part Daughter handles all money issues  Ongoing hand pain Tylenol helps some RA diagnosis in question--may just be OA Doesn't feel she needs more  Doesn't seem depressed Daughter notes anxiety---picks at her hair and face  No chest pain No SOB No palpitations Does walk neighborhood--no change in exercise tolerance  Current Outpatient Prescriptions on File Prior to Visit  Medication Sig Dispense Refill  . azelastine (ASTELIN) 137 MCG/SPRAY nasal spray Use one spray in each nostril two times a day as needed for allergy symptoms       . Calcium Carbonate (CALCIUM 600) 1500 MG TABS Take by mouth daily.        . clopidogrel (PLAVIX) 75 MG tablet TAKE 1 TABLET BY MOUTH DAILY.  30 tablet  0  . donepezil (ARICEPT) 10 MG tablet TAKE 1 TABLET (10 MG TOTAL) BY MOUTH DAILY.  30 tablet  0  . furosemide (LASIX) 20 MG tablet TAKE 1 TABLET BY MOUTH EVERY OTHER DAY  90 tablet  1  . levothyroxine (SYNTHROID, LEVOTHROID) 75 MCG tablet TAKE 1 TABLET EVERY DAY  30 tablet  1  . LORazepam (ATIVAN) 0.5 MG tablet Take 1/2 by mouth two times a day as needed for nerves       . meclizine (ANTIVERT) 25 MG tablet Take 1/2 to 1 by mouth three times a day as needed for dizziness       . metoprolol succinate (TOPROL-XL) 25 MG 24 hr tablet Take 1 tablet (25 mg total) by mouth daily.  30 tablet  0  . potassium chloride (KLOR-CON M10) 10 MEQ tablet Take 1 tablet (10 mEq total) by mouth daily.  30 tablet  0  . DISCONTD: FLUoxetine (PROZAC) 20 MG capsule TAKE 1 CAPSULE (20  MG TOTAL) BY MOUTH DAILY.  30 capsule  0    Allergies  Allergen Reactions  . Aspirin     Past Medical History  Diagnosis Date  . CAD (coronary artery disease) 2006    Anterior MI in [place] in 2006. 2 stents in LAD, 1 stent in PDA. Adenosine myoview  (7/10): EF 60%, septal apical infarct. Low risk study. Adenosine myoview (4/11): EF > 55%, mid anteroseptal,  apical septal, and true apex infarct with no ischemia. Low risk study.  . Hypertension   . Parotitis     2 prior bouts  . Hypothyroidism   . Depression   . Allergic to aspirin     description sounds like anaphylaxis  . Diastolic CHF, acute     echo (1/47) EF 50-55%, mid to apical anteroseptal  hypokinesis, diatolic dysfunction, PASP 33 mm/Hg  . Osteoarthritis   . Alzheimer's disease   . Rheumatoid arthritis 2011    diagnosis in question  . Cataracts, bilateral   . Salivary gland infection 03/09    gland on left    Past Surgical History  Procedure Date  . Anal sphincterotomy 09/01    and Fissurectomy- [facility]  . Nasal polyp surgery 1965  . Hemorrhoid surgery 01/01    ligation  .  Coronary stent placement 03/06    MI/3    Family History  Problem Relation Age of Onset  . Heart disease Father     MI   Review of Systems Sleeps well Appetite is fine Weight is up slightly Bowels are fine    Objective:   Physical Exam  Constitutional: She appears well-developed and well-nourished. No distress.  Neck: Normal range of motion. Neck supple. No thyromegaly present.  Cardiovascular: Normal rate, regular rhythm, normal heart sounds and intact distal pulses.  Exam reveals no gallop.   No murmur heard. Pulmonary/Chest: Effort normal and breath sounds normal. No respiratory distress. She has no wheezes. She has no rales.  Musculoskeletal: She exhibits no edema.       All hand joints are thickened but no active synovitis  Lymphadenopathy:    She has no cervical adenopathy.  Psychiatric: She has a normal mood and affect.  Her behavior is normal. Thought content normal.          Assessment & Plan:

## 2012-06-07 NOTE — Assessment & Plan Note (Signed)
Neutral fluid status No changes needed Will check labs

## 2012-06-07 NOTE — Assessment & Plan Note (Signed)
Does okay with the tylenol If worsens, could restart the tramadol

## 2012-06-07 NOTE — Assessment & Plan Note (Signed)
Some anxious behaviors but not depressed Will decrease the fluoxetine to every other day

## 2012-06-14 ENCOUNTER — Encounter: Payer: Self-pay | Admitting: *Deleted

## 2012-06-17 ENCOUNTER — Other Ambulatory Visit: Payer: Self-pay | Admitting: Internal Medicine

## 2012-07-28 ENCOUNTER — Telehealth: Payer: Self-pay

## 2012-07-28 MED ORDER — FLUOXETINE HCL 20 MG PO CAPS: 20.0000 mg | ORAL_CAPSULE | Freq: Every day | ORAL | Status: DC

## 2012-07-28 NOTE — Telephone Encounter (Signed)
rx sent to pharmacy by e-script Left message that rx was sent to the pharmacy

## 2012-07-28 NOTE — Telephone Encounter (Signed)
Debra care taker left v/m;  Pt supposed to take prozac every other day;pts family decided pt needs to take prozac daily. Pt will be out of med 07/29/12. Request prescription changed to daily and sent to [facility].Please advise.

## 2012-07-28 NOTE — Telephone Encounter (Signed)
Okay to call pharmacy and change sig back to daily #30 x 11 (or #90 x 3)

## 2012-09-17 ENCOUNTER — Other Ambulatory Visit: Payer: Self-pay | Admitting: Cardiology

## 2012-09-21 ENCOUNTER — Other Ambulatory Visit: Payer: Self-pay | Admitting: Internal Medicine

## 2012-12-07 ENCOUNTER — Ambulatory Visit: Payer: Medicare Other | Admitting: Internal Medicine

## 2012-12-08 ENCOUNTER — Encounter: Payer: Self-pay | Admitting: Internal Medicine

## 2012-12-08 ENCOUNTER — Ambulatory Visit (INDEPENDENT_AMBULATORY_CARE_PROVIDER_SITE_OTHER): Payer: Medicare Other | Admitting: Internal Medicine

## 2012-12-08 VITALS — BP 110/60 | HR 65 | Temp 98.6°F | Wt 174.0 lb

## 2012-12-08 DIAGNOSIS — G309 Alzheimer's disease, unspecified: Secondary | ICD-10-CM

## 2012-12-08 DIAGNOSIS — I5032 Chronic diastolic (congestive) heart failure: Secondary | ICD-10-CM

## 2012-12-08 DIAGNOSIS — M19049 Primary osteoarthritis, unspecified hand: Secondary | ICD-10-CM

## 2012-12-08 DIAGNOSIS — F0280 Dementia in other diseases classified elsewhere without behavioral disturbance: Secondary | ICD-10-CM

## 2012-12-08 DIAGNOSIS — F329 Major depressive disorder, single episode, unspecified: Secondary | ICD-10-CM

## 2012-12-08 MED ORDER — CLOPIDOGREL BISULFATE 75 MG PO TABS: 75.0000 mg | ORAL_TABLET | Freq: Every day | ORAL | Status: DC

## 2012-12-08 NOTE — Assessment & Plan Note (Signed)
Mild and seems stable Still independent with some help On the donepezil

## 2012-12-08 NOTE — Progress Notes (Signed)
Subjective:    Patient ID: Leslie Curry, female    DOB: July 16, 1931, 79 y.o.   MRN: [id number]  HPI Here with daughter Gavin Pound  Doing really well Daughter still handles the finances Still walks in community, has social interaction (goes out with friends) 2 dogs Cleaning lady brings prepared foods Independent with ADLs--- does laundry and dishwasher, etc  Hands are okay Still has some trouble functionally Pain is not an issue unless she has to squeeze something  No chest pain No SOB--but daughter notes DOE if she hustles No edema  Current Outpatient Prescriptions on File Prior to Visit  Medication Sig Dispense Refill  . azelastine (ASTELIN) 137 MCG/SPRAY nasal spray Place 1 spray into the nose 2 (two) times daily as needed.  30 mL  11  . Calcium Carbonate (CALCIUM 600) 1500 MG TABS Take by mouth daily.        Marland Kitchen donepezil (ARICEPT) 10 MG tablet Take 1 tablet (10 mg total) by mouth at bedtime.  90 tablet  3  . FLUoxetine (PROZAC) 20 MG capsule Take 1 capsule (20 mg total) by mouth daily.  90 capsule  3  . furosemide (LASIX) 20 MG tablet Take 1 tablet (20 mg total) by mouth every other day.  45 tablet  3  . levothyroxine (SYNTHROID, LEVOTHROID) 75 MCG tablet TAKE 1 TABLET EVERY DAY  90 tablet  3  . LORazepam (ATIVAN) 0.5 MG tablet Take 1/2 by mouth two times a day as needed for nerves       . meclizine (ANTIVERT) 25 MG tablet Take 1/2 to 1 by mouth three times a day as needed for dizziness       . metoprolol succinate (TOPROL-XL) 25 MG 24 hr tablet Take 1 tablet (25 mg total) by mouth daily.  90 tablet  3  . potassium chloride (KLOR-CON M10) 10 MEQ tablet Take 1 tablet (10 mEq total) by mouth daily.  30 tablet  0   No current facility-administered medications on file prior to visit.    Allergies  Allergen Reactions  . Aspirin     Past Medical History  Diagnosis Date  . CAD (coronary artery disease) 2006    Anterior MI in [place] in 2006. 2 stents in LAD, 1 stent in PDA.  Adenosine myoview  (7/10): EF 60%, septal apical infarct. Low risk study. Adenosine myoview (4/11): EF > 55%, mid anteroseptal,  apical septal, and true apex infarct with no ischemia. Low risk study.  . Hypertension   . Parotitis     2 prior bouts  . Hypothyroidism   . Depression   . Allergic to aspirin     description sounds like anaphylaxis  . Diastolic CHF, acute     echo (1/19) EF 50-55%, mid to apical anteroseptal  hypokinesis, diatolic dysfunction, PASP 33 mm/Hg  . Osteoarthritis   . Alzheimer's disease   . Rheumatoid arthritis 2011    diagnosis in question  . Cataracts, bilateral   . Salivary gland infection 03/09    gland on left    Past Surgical History  Procedure Laterality Date  . Anal sphincterotomy  09/01    and Fissurectomy- [facility]  . Nasal polyp surgery  1965  . Hemorrhoid surgery  01/01    ligation  . Coronary stent placement  03/06    MI/3    Family History  Problem Relation Age of Onset  . Heart disease Father     MI    History   Social History  .  Marital Status: Married    Spouse Name: N/A    Number of Children: 5  . Years of Education: N/A   Occupational History  .     Social History Main Topics  . Smoking status: Never Smoker   . Smokeless tobacco: Never Used  . Alcohol Use: Yes     Comment: Occassionally  . Drug Use: Not on file  . Sexually Active: Not on file   Other Topics Concern  . Not on file   Social History Narrative   Divorced then remarried-widowed 10/02. 5 daughters.       No living will   Would want daughters Gavin Pound and Inetta Fermo to be health care POAs   Not sure about DNR---probably would accept at least attempts   No tube feedings if cognitively unaware   Review of Systems Sleeps very well Appetite is good Weight is stable     Objective:   Physical Exam  Constitutional: She appears well-developed and well-nourished. No distress.  Neck: Normal range of motion. Neck supple. No thyromegaly present.  Cardiovascular:  Normal rate, regular rhythm and normal heart sounds.  Exam reveals no gallop.   No murmur heard. Pulmonary/Chest: Effort normal and breath sounds normal. No respiratory distress. She has no wheezes. She has no rales.  Musculoskeletal: She exhibits no edema.  Same thickening in hands  Lymphadenopathy:    She has no cervical adenopathy.  Psychiatric:  Back to her usual boisterous personality Mood is good [place] with daughter          Assessment & Plan:

## 2012-12-08 NOTE — Assessment & Plan Note (Signed)
Mood is much better Continue the med

## 2012-12-08 NOTE — Assessment & Plan Note (Signed)
Has adjusted Doesn't want narcotics as yet

## 2012-12-08 NOTE — Assessment & Plan Note (Signed)
Has worsening DOE but is in denial Tried to encourage her to increase her walking No change in fluid status

## 2012-12-18 ENCOUNTER — Other Ambulatory Visit: Payer: Self-pay

## 2012-12-18 MED ORDER — HYDROCODONE-ACETAMINOPHEN 5-325 MG PO TABS: 1.0000 | ORAL_TABLET | Freq: Two times a day (BID) | ORAL | Status: DC | PRN

## 2012-12-18 NOTE — Telephone Encounter (Signed)
Leslie Curry let v/m requesting refill hydrocodone apap [facility]. pt has been exercising and pt having pain in hips that tylenol does not help.Please advise.

## 2012-12-18 NOTE — Telephone Encounter (Signed)
Okay to add back hydrocodone 5/325 1 bid prn for severe pain #60 x 0

## 2012-12-18 NOTE — Telephone Encounter (Signed)
rx called into pharmacy Left message on machine that rx was phoned in

## 2013-03-01 ENCOUNTER — Other Ambulatory Visit: Payer: Self-pay | Admitting: Cardiology

## 2013-06-11 ENCOUNTER — Encounter: Payer: Self-pay | Admitting: Internal Medicine

## 2013-06-11 ENCOUNTER — Ambulatory Visit (INDEPENDENT_AMBULATORY_CARE_PROVIDER_SITE_OTHER): Payer: Medicare Other | Admitting: Internal Medicine

## 2013-06-11 VITALS — BP 120/80 | HR 72 | Temp 98.0°F | Wt 174.0 lb

## 2013-06-11 DIAGNOSIS — F329 Major depressive disorder, single episode, unspecified: Secondary | ICD-10-CM

## 2013-06-11 DIAGNOSIS — F0280 Dementia in other diseases classified elsewhere without behavioral disturbance: Secondary | ICD-10-CM

## 2013-06-11 DIAGNOSIS — M19049 Primary osteoarthritis, unspecified hand: Secondary | ICD-10-CM

## 2013-06-11 DIAGNOSIS — I2510 Atherosclerotic heart disease of native coronary artery without angina pectoris: Secondary | ICD-10-CM

## 2013-06-11 DIAGNOSIS — I5032 Chronic diastolic (congestive) heart failure: Secondary | ICD-10-CM

## 2013-06-11 DIAGNOSIS — E039 Hypothyroidism, unspecified: Secondary | ICD-10-CM

## 2013-06-11 NOTE — Assessment & Plan Note (Signed)
No symptoms Will hold off on statin given her dementia

## 2013-06-11 NOTE — Assessment & Plan Note (Signed)
Weight stable on furosemide Has been off potassium Will just check labs

## 2013-06-11 NOTE — Assessment & Plan Note (Signed)
Still doing okay in house Has slacked off on personal hygiene--but is fighting any more help Continue the med

## 2013-06-11 NOTE — Progress Notes (Signed)
Subjective:    Patient ID: Leslie Curry, female    DOB: 1930/10/17, 79 y.o.   MRN: [id number]  HPI Here with daughter Gavin Pound  Things are going fine Excited about her 28 year old great granddaughter Still lives alone Housekeeper once a week Daughter takes her out shopping, handles bills Cooks in microwave Still independent with all ADLs  No chest pain  No SOB No edema No dizziness, etc  No major pain in hands Satisfied  Some limitation but nothing worrisome  Current Outpatient Prescriptions on File Prior to Visit  Medication Sig Dispense Refill  . azelastine (ASTELIN) 137 MCG/SPRAY nasal spray Place 1 spray into the nose 2 (two) times daily as needed.  30 mL  11  . clopidogrel (PLAVIX) 75 MG tablet Take 1 tablet (75 mg total) by mouth daily.  90 tablet  3  . donepezil (ARICEPT) 10 MG tablet Take 1 tablet (10 mg total) by mouth at bedtime.  90 tablet  3  . FLUoxetine (PROZAC) 20 MG capsule Take 1 capsule (20 mg total) by mouth daily.  90 capsule  3  . HYDROcodone-acetaminophen (NORCO/VICODIN) 5-325 MG per tablet Take 1 tablet by mouth 2 (two) times daily as needed.  60 tablet  0  . levothyroxine (SYNTHROID, LEVOTHROID) 75 MCG tablet TAKE 1 TABLET EVERY DAY  90 tablet  3  . LORazepam (ATIVAN) 0.5 MG tablet Take 1/2 by mouth two times a day as needed for nerves      . meclizine (ANTIVERT) 25 MG tablet Take 1/2 to 1 by mouth three times a day as needed for dizziness        No current facility-administered medications on file prior to visit.    Allergies  Allergen Reactions  . Aspirin     Past Medical History  Diagnosis Date  . CAD (coronary artery disease) 2006    Anterior MI in [place] in 2006. 2 stents in LAD, 1 stent in PDA. Adenosine myoview  (7/10): EF 60%, septal apical infarct. Low risk study. Adenosine myoview (4/11): EF > 55%, mid anteroseptal,  apical septal, and true apex infarct with no ischemia. Low risk study.  . Hypertension   . Parotitis     2 prior  bouts  . Hypothyroidism   . Depression   . Allergic to aspirin     description sounds like anaphylaxis  . Diastolic CHF, acute     echo (4/09) EF 50-55%, mid to apical anteroseptal  hypokinesis, diatolic dysfunction, PASP 33 mm/Hg  . Osteoarthritis   . Alzheimer's disease   . Rheumatoid arthritis(714.0) 2011    diagnosis in question  . Cataracts, bilateral   . Salivary gland infection 03/09    gland on left    Past Surgical History  Procedure Laterality Date  . Anal sphincterotomy  09/01    and Fissurectomy- [facility]  . Nasal polyp surgery  1965  . Hemorrhoid surgery  01/01    ligation  . Coronary stent placement  03/06    MI/3    Family History  Problem Relation Age of Onset  . Heart disease Father     MI    History   Social History  . Marital Status: Married    Spouse Name: N/A    Number of Children: 5  . Years of Education: N/A   Occupational History  .     Social History Main Topics  . Smoking status: Never Smoker   . Smokeless tobacco: Never Used  . Alcohol Use:  Yes     Comment: Occassionally  . Drug Use: Not on file  . Sexual Activity: Not on file   Other Topics Concern  . Not on file   Social History Narrative   Divorced then remarried-widowed 10/02. 5 daughters.       No living will   Would want daughters Gavin Pound and Inetta Fermo to be health care POAs   Not sure about DNR---probably would accept at least attempts   No tube feedings if cognitively unaware   Review of Systems Appetite is fine Weight is stable Sleeps well     Objective:   Physical Exam  Constitutional: She appears well-developed and well-nourished. No distress.  Neck: Normal range of motion. Neck supple. No thyromegaly present.  Cardiovascular: Normal rate, regular rhythm and normal heart sounds.  Exam reveals no gallop.   No murmur heard. Pulmonary/Chest: Effort normal and breath sounds normal. No respiratory distress. She has no wheezes. She has no rales.  Musculoskeletal: She  exhibits no edema and no tenderness.  Lymphadenopathy:    She has no cervical adenopathy.  Psychiatric: She has a normal mood and affect. Her behavior is normal.  Judgement is impaired and insight is limited          Assessment & Plan:

## 2013-06-11 NOTE — Assessment & Plan Note (Signed)
Mood is okay on fluoxetine

## 2013-06-11 NOTE — Assessment & Plan Note (Signed)
Under control No changes needed

## 2013-06-12 LAB — CBC WITH DIFFERENTIAL/PLATELET
Basophils Absolute: 0 10*3/uL (ref 0.0–0.1)
Basophils Relative: 0.3 % (ref 0.0–3.0)
Eosinophils Absolute: 0.1 10*3/uL (ref 0.0–0.7)
Hemoglobin: 14.6 g/dL (ref 12.0–15.0)
MCHC: 35.2 g/dL (ref 30.0–36.0)
MCV: 95.1 fl (ref 78.0–100.0)
Monocytes Absolute: 0.4 10*3/uL (ref 0.1–1.0)
Neutro Abs: 4.3 10*3/uL (ref 1.4–7.7)
Neutrophils Relative %: 62.2 % (ref 43.0–77.0)
RBC: 4.37 Mil/uL (ref 3.87–5.11)
RDW: 12.7 % (ref 11.5–14.6)

## 2013-06-12 LAB — HEPATIC FUNCTION PANEL
ALT: 19 U/L (ref 0–35)
Total Protein: 7.5 g/dL (ref 6.0–8.3)

## 2013-06-12 LAB — BASIC METABOLIC PANEL
CO2: 28 mEq/L (ref 19–32)
Chloride: 106 mEq/L (ref 96–112)
Creatinine, Ser: 0.8 mg/dL (ref 0.4–1.2)
Glucose, Bld: 96 mg/dL (ref 70–99)

## 2013-06-12 LAB — LIPID PANEL
Cholesterol: 254 mg/dL — ABNORMAL HIGH (ref 0–200)
Total CHOL/HDL Ratio: 7
Triglycerides: 266 mg/dL — ABNORMAL HIGH (ref 0.0–149.0)

## 2013-06-12 LAB — LDL CHOLESTEROL, DIRECT: Direct LDL: 164.9 mg/dL

## 2013-06-12 LAB — T4, FREE: Free T4: 0.86 ng/dL (ref 0.60–1.60)

## 2013-06-13 ENCOUNTER — Other Ambulatory Visit: Payer: Self-pay | Admitting: Family Medicine

## 2013-06-13 NOTE — Telephone Encounter (Signed)
Okay #30 x 0 

## 2013-06-13 NOTE — Telephone Encounter (Signed)
rx called into pharmacy

## 2013-06-13 NOTE — Telephone Encounter (Signed)
Ok to refill 

## 2013-06-14 ENCOUNTER — Encounter: Payer: Self-pay | Admitting: *Deleted

## 2013-06-22 ENCOUNTER — Other Ambulatory Visit: Payer: Self-pay

## 2013-06-22 NOTE — Telephone Encounter (Signed)
Gavin Pound, pts daughter left v/m requesting rx hydrocodone apap. Call when ready for pick up.

## 2013-06-25 MED ORDER — HYDROCODONE-ACETAMINOPHEN 5-325 MG PO TABS: 1.0000 | ORAL_TABLET | Freq: Two times a day (BID) | ORAL | Status: DC | PRN

## 2013-06-25 NOTE — Telephone Encounter (Signed)
Spoke with patient's daughter and advised rx ready for pick-up and it will be at the front desk.  

## 2013-06-26 ENCOUNTER — Other Ambulatory Visit: Payer: Self-pay | Admitting: Internal Medicine

## 2013-09-03 ENCOUNTER — Other Ambulatory Visit: Payer: Self-pay | Admitting: Internal Medicine

## 2013-10-01 ENCOUNTER — Other Ambulatory Visit: Payer: Self-pay | Admitting: Internal Medicine

## 2013-10-01 MED ORDER — HYDROCODONE-ACETAMINOPHEN 5-325 MG PO TABS: 1.0000 | ORAL_TABLET | Freq: Two times a day (BID) | ORAL | Status: DC | PRN

## 2013-10-01 MED ORDER — LORAZEPAM 0.5 MG PO TABS: 0.2500 mg | ORAL_TABLET | Freq: Two times a day (BID) | ORAL | Status: AC | PRN

## 2013-10-01 MED ORDER — HYDROCODONE-ACETAMINOPHEN 5-325 MG PO TABS: 1.0000 | ORAL_TABLET | Freq: Two times a day (BID) | ORAL | Status: AC | PRN

## 2013-10-01 NOTE — Telephone Encounter (Signed)
Pt is needing a refill per pt's pharmacy on larazopam and hydrocodone. Pt's caretaker's name is Hilda Blades her number is [phone] please call her when rx is ready. Please advise.

## 2013-10-01 NOTE — Telephone Encounter (Signed)
Please phone in the lorazepam 

## 2013-10-01 NOTE — Telephone Encounter (Signed)
Left message on machine that rx is ready for pick-up, and it will be at our front desk. rx called into pharmacy  

## 2013-10-01 NOTE — Telephone Encounter (Signed)
vicodin 06/22/13 Lorazepam 06/13/13

## 2013-12-09 ENCOUNTER — Other Ambulatory Visit: Payer: Self-pay | Admitting: Internal Medicine

## 2013-12-27 ENCOUNTER — Other Ambulatory Visit: Payer: Self-pay | Admitting: Internal Medicine

## 2014-01-01 ENCOUNTER — Telehealth: Payer: Self-pay | Admitting: Internal Medicine

## 2014-01-01 NOTE — Telephone Encounter (Signed)
Pt's daughter, Hilda Blades, calling regarding concern she has with pt. She said she is fine to wait to hear from Dr Silvio Pate til tomorrow. States pt is not eatting, lost 15 lbs. Just wants his advice on wether or not she should wait til pts appt next week with Dr Silvio Pate or bring her in sooner. Thank you

## 2014-01-02 ENCOUNTER — Encounter: Payer: Self-pay | Admitting: Internal Medicine

## 2014-01-02 ENCOUNTER — Ambulatory Visit (INDEPENDENT_AMBULATORY_CARE_PROVIDER_SITE_OTHER): Payer: Medicare Other | Admitting: Internal Medicine

## 2014-01-02 ENCOUNTER — Telehealth: Payer: Self-pay | Admitting: Internal Medicine

## 2014-01-02 VITALS — BP 88/58 | HR 81 | Temp 97.9°F | Wt 147.0 lb

## 2014-01-02 DIAGNOSIS — M549 Dorsalgia, unspecified: Secondary | ICD-10-CM

## 2014-01-02 DIAGNOSIS — R630 Anorexia: Secondary | ICD-10-CM

## 2014-01-02 DIAGNOSIS — R0602 Shortness of breath: Secondary | ICD-10-CM

## 2014-01-02 DIAGNOSIS — R631 Polydipsia: Secondary | ICD-10-CM

## 2014-01-02 DIAGNOSIS — R634 Abnormal weight loss: Secondary | ICD-10-CM

## 2014-01-02 LAB — POCT URINALYSIS DIPSTICK
Blood, UA: NEGATIVE
GLUCOSE UA: NEGATIVE
Ketones, UA: NEGATIVE
Nitrite, UA: NEGATIVE
Spec Grav, UA: 1.01
Urobilinogen, UA: 1
pH, UA: 6

## 2014-01-02 NOTE — Addendum Note (Signed)
Addended by: Lurlean Nanny on: 01/02/2014 04:30 PM   Modules accepted: Orders

## 2014-01-02 NOTE — Telephone Encounter (Signed)
She has an appt next Wednesday This should be okay unless there is something critical--like shortness of breath or chest pain She may want to try her on supplements like ensure or boost mixed with ice cream (if she hasn't tried this already)  This may be a sign that she needs more help.

## 2014-01-02 NOTE — Telephone Encounter (Signed)
Rose spoke with patient's daughter and scheduled appt today with Webb Silversmith, NP

## 2014-01-02 NOTE — Telephone Encounter (Signed)
Patient Information:  Caller Name: Hilda Blades  Phone: [phone]  Patient: Leslie, Curry  Gender: Female  DOB: 1930-10-24  Age: 80 Years  PCP: Viviana Simpler [facility])  Office Follow Up:  Does the office need to follow up with this patient?: No  Instructions For The Office: N/A  RN Note:  She has appointment at 15:15.  Symptoms  Reason For Call & Symptoms: Daughter calling.  Relates estimated 2 weeks ago she was getting ready to go on a trip; she grabbed chest and reported "catch in her chest".  Patient has Alzheimer's Disease.  Pain increased with raising arms above the head or breathing hard.  Caller relates she has to "force feed her" and although she is drinking liquids, she feels very thirsty/ mouth is very dry.  Emergent symptoms ruled out.  See Today in Office epr Chest Pain guideline due to Intermittent chest pains persist > 3 days.  Reviewed Health History In EMR: Yes  Reviewed Medications In EMR: Yes  Reviewed Allergies In EMR: Yes  Reviewed Surgeries / Procedures: Yes  Date of Onset of Symptoms: 12/19/2013  Treatments Tried: Encouraging her to eat; offering liquids  Treatments Tried Worked: No  Guideline(s) Used:  Chest Pain  Disposition Per Guideline:   See Today in Office  Reason For Disposition Reached:   Intermittent chest pains persist > 3 days  Advice Given:  Fleeting Chest Pain:  Fleeting chest pains that last only a few seconds and then go away are generally not serious. They may be from pinched muscles or nerves in your chest wall.  Call Back If:  Severe chest pain  Difficulty breathing  Fever  You become worse.  RN Overrode Recommendation:  Patient Already Has Appt, Document Patient  Has appointment at 15:15.

## 2014-01-02 NOTE — Progress Notes (Signed)
Pre visit review using our clinic review tool, if applicable. No additional management support is needed unless otherwise documented below in the visit note. 

## 2014-01-02 NOTE — Progress Notes (Signed)
Subjective:    Patient ID: Leslie Curry, female    DOB: Feb 07, 1931, 80 y.o.   MRN: [id number]  HPI  Pt presents to the clinic today with c/o poor appetite, weight loss, pain on the right side of her abdomen and increased thirst. She reports this has been going on for the last week. She has been drinking fluids but has no appetite what so ever. She has lost 26 pounds since October 2014. Her daughter reports that she does feed herself. She is eating panera bread, soup and lots of fruit. Her daughter reports her baseline cognitive function has not changed. She has not recently started or stopped any medications. She denies fever, chills or body aches.  Review of Systems      Past Medical History  Diagnosis Date  . CAD (coronary artery disease) 2006    Anterior MI in [place] in 2006. 2 stents in LAD, 1 stent in PDA. Adenosine myoview  (7/10): EF 60%, septal apical infarct. Low risk study. Adenosine myoview (4/11): EF > 55%, mid anteroseptal,  apical septal, and true apex infarct with no ischemia. Low risk study.  . Hypertension   . Parotitis     2 prior bouts  . Hypothyroidism   . Depression   . Allergic to aspirin     description sounds like anaphylaxis  . Diastolic CHF, acute     echo (7/10) EF 50-55%, mid to apical anteroseptal  hypokinesis, diatolic dysfunction, PASP 33 mm/Hg  . Osteoarthritis   . Alzheimer's disease   . Rheumatoid arthritis(714.0) 2011    diagnosis in question  . Cataracts, bilateral   . Salivary gland infection 03/09    gland on left    Current Outpatient Prescriptions  Medication Sig Dispense Refill  . azelastine (ASTELIN) 137 MCG/SPRAY nasal spray Place 1 spray into the nose 2 (two) times daily as needed.  30 mL  11  . clopidogrel (PLAVIX) 75 MG tablet TAKE 1 TABLET (75 MG TOTAL) BY MOUTH DAILY.  90 tablet  2  . donepezil (ARICEPT) 10 MG tablet TAKE 1 TABLET BY MOUTH AT BEDTIME  90 tablet  3  . FLUoxetine (PROZAC) 20 MG capsule TAKE ONE CAPSULE BY  MOUTH EVERY DAY  90 capsule  3  . furosemide (LASIX) 20 MG tablet Take 20 mg by mouth every other day.      Marland Kitchen HYDROcodone-acetaminophen (NORCO/VICODIN) 5-325 MG per tablet Take 1 tablet by mouth 2 (two) times daily as needed.  60 tablet  0  . levothyroxine (SYNTHROID, LEVOTHROID) 75 MCG tablet TAKE 1 TABLET EVERY DAY  90 tablet  2  . LORazepam (ATIVAN) 0.5 MG tablet Take 0.5 tablets (0.25 mg total) by mouth 2 (two) times daily as needed.  30 tablet  0  . meclizine (ANTIVERT) 25 MG tablet Take 1/2 to 1 by mouth three times a day as needed for dizziness       . metoprolol succinate (TOPROL-XL) 25 MG 24 hr tablet TAKE 1 TABLET BY MOUTH EVERY DAY  90 tablet  2   No current facility-administered medications for this visit.    Allergies  Allergen Reactions  . Aspirin     Family History  Problem Relation Age of Onset  . Heart disease Father     MI    History   Social History  . Marital Status: Married    Spouse Name: N/A    Number of Children: 10  . Years of Education: N/A   Occupational History  .  Social History Main Topics  . Smoking status: Never Smoker   . Smokeless tobacco: Never Used  . Alcohol Use: Yes     Comment: Occassionally  . Drug Use: Not on file  . Sexual Activity: Not on file   Other Topics Concern  . Not on file   Social History Narrative   Divorced then remarried-widowed 10/02. 5 daughters.       No living will   Would want daughters Neoma Laming and Otila Kluver to be health care POAs   Not sure about DNR---probably would accept at least attempts   No tube feedings if cognitively unaware     Constitutional: Denies fever, malaise, fatigue, headache or abrupt weight changes.  HEENT: Pt reports dry mouth. Denies eye pain, eye redness, ear pain, ringing in the ears, wax buildup, runny nose, nasal congestion, bloody nose, or sore throat. Respiratory: Pt reports shortness of breath. Denies difficulty breathing, cough or sputum production.   Cardiovascular: Denies  chest pain, chest tightness, palpitations or swelling in the hands or feet.  Gastrointestinal: Pt reports RUQ pain. Denies bloating, constipation, diarrhea or blood in the stool.   No other specific complaints in a complete review of systems (except as listed in HPI above).  Objective:   Physical Exam   BP 88/58  Pulse 81  Temp(Src) 97.9 F (36.6 C) (Oral)  Wt 147 lb (66.679 kg)  SpO2 96% Wt Readings from Last 3 Encounters:  01/02/14 147 lb (66.679 kg)  06/11/13 174 lb (78.926 kg)  12/08/12 174 lb (78.926 kg)    General: Appears her stated age, chronically ill appearing in NAD. Skin: Warm, dry and intact. Tenting noted. HEENT: Head: normal shape and size; Eyes: sclera white, no icterus, conjunctiva pink, PERRLA and EOMs intact; Ears: Tm's gray and intact, normal light reflex; Nose: mucosa pink and moist, septum midline; Throat/Mouth: Teeth present, mucosa pink and dry, no exudate, lesions or ulcerations noted.  Cardiovascular: Normal rate and rhythm. S1,S2 noted.  No murmur, rubs or gallops noted. No JVD or BLE edema. No carotid bruits noted. Pulmonary/Chest: Normal effort and fine crackles noted bilaterally. No respiratory distress. No wheezes,  or ronchi noted.  Abdomen: Soft and nontender. Normal bowel sounds, no bruits noted. No distention or masses noted. Liver, spleen and kidneys non palpable. CVA tenderness noted on the right.     BMET    Component Value Date/Time   NA 140 06/11/2013 1700   K 4.5 06/11/2013 1700   CL 106 06/11/2013 1700   CO2 28 06/11/2013 1700   GLUCOSE 96 06/11/2013 1700   BUN 10 06/11/2013 1700   CREATININE 0.8 06/11/2013 1700   CALCIUM 9.6 06/11/2013 1700   GFRNONAA 103.07 02/25/2010 1230   GFRAA 90 07/08/2008 1129    Lipid Panel     Component Value Date/Time   CHOL 254* 06/11/2013 1700   TRIG 266.0* 06/11/2013 1700   HDL 36.20* 06/11/2013 1700   CHOLHDL 7 06/11/2013 1700   VLDL 53.2* 06/11/2013 1700   LDLCALC 74 09/23/2009 1108    CBC      Component Value Date/Time   WBC 6.9 06/11/2013 1700   RBC 4.37 06/11/2013 1700   HGB 14.6 06/11/2013 1700   HCT 41.5 06/11/2013 1700   PLT 208.0 06/11/2013 1700   MCV 95.1 06/11/2013 1700   MCHC 35.2 06/11/2013 1700   RDW 12.7 06/11/2013 1700   LYMPHSABS 2.0 06/11/2013 1700   MONOABS 0.4 06/11/2013 1700   EOSABS 0.1 06/11/2013 1700   BASOSABS 0.0 06/11/2013  1700    Hgb A1C No results found for this basename: HGBA1C        Assessment & Plan:   Weight loss, poor appetite, RUQ pain, CVA tenderness, shortness of breath:  Symptoms are vague Urinalysis: mod leuks, trace bilirubin Will send for culture Will check CBC and cmet She does appear dehydrated today-encouraged her to push fluids, try ensure/boost if she wont eat and try to encourage her to eat. Will consider starting remeron but will discuss with Dr. Silvio Pate first Seems to have a slight progression of alzheimers, may be time to consider having a aide to come in and help versus going to a SNF  Will follow up after labs are back.

## 2014-01-03 ENCOUNTER — Other Ambulatory Visit: Payer: Self-pay | Admitting: Internal Medicine

## 2014-01-03 ENCOUNTER — Ambulatory Visit: Payer: Self-pay | Admitting: Internal Medicine

## 2014-01-03 DIAGNOSIS — R1011 Right upper quadrant pain: Secondary | ICD-10-CM

## 2014-01-03 DIAGNOSIS — R748 Abnormal levels of other serum enzymes: Secondary | ICD-10-CM

## 2014-01-03 LAB — COMPREHENSIVE METABOLIC PANEL
ALK PHOS: 154 U/L — AB (ref 39–117)
ALT: 22 U/L (ref 0–35)
AST: 50 U/L — AB (ref 0–37)
Albumin: 3.2 g/dL — ABNORMAL LOW (ref 3.5–5.2)
BILIRUBIN TOTAL: 2.6 mg/dL — AB (ref 0.3–1.2)
BUN: 18 mg/dL (ref 6–23)
CO2: 25 mEq/L (ref 19–32)
Calcium: 9.1 mg/dL (ref 8.4–10.5)
Chloride: 95 mEq/L — ABNORMAL LOW (ref 96–112)
Creatinine, Ser: 1 mg/dL (ref 0.4–1.2)
GFR: 56.6 mL/min — ABNORMAL LOW (ref >60.00)
Glucose, Bld: 106 mg/dL — ABNORMAL HIGH (ref 70–99)
Potassium: 3.6 mEq/L (ref 3.5–5.1)
SODIUM: 133 meq/L — AB (ref 135–145)
Total Protein: 7.3 g/dL (ref 6.0–8.3)

## 2014-01-03 LAB — CBC
HCT: 40.5 % (ref 36.0–46.0)
Hemoglobin: 13.8 g/dL (ref 12.0–15.0)
MCHC: 34.1 g/dL (ref 30.0–36.0)
MCV: 92.8 fl (ref 78.0–100.0)
PLATELETS: 315 10*3/uL (ref 150.0–400.0)
RBC: 4.36 Mil/uL (ref 3.87–5.11)
RDW: 13.7 % (ref 11.5–14.6)
WBC: 14.3 10*3/uL — ABNORMAL HIGH (ref 4.5–10.5)

## 2014-01-03 LAB — HEMOGLOBIN A1C: Hgb A1c MFr Bld: 5.1 % (ref 4.6–6.5)

## 2014-01-04 ENCOUNTER — Encounter: Payer: Self-pay | Admitting: Internal Medicine

## 2014-01-04 ENCOUNTER — Ambulatory Visit (INDEPENDENT_AMBULATORY_CARE_PROVIDER_SITE_OTHER): Payer: Medicare Other | Admitting: Internal Medicine

## 2014-01-04 VITALS — BP 100/60 | HR 101 | Temp 97.7°F | Wt 146.0 lb

## 2014-01-04 DIAGNOSIS — K8689 Other specified diseases of pancreas: Secondary | ICD-10-CM | POA: Insufficient documentation

## 2014-01-04 DIAGNOSIS — K869 Disease of pancreas, unspecified: Secondary | ICD-10-CM

## 2014-01-04 LAB — URINE CULTURE

## 2014-01-04 NOTE — Progress Notes (Signed)
Subjective:    Patient ID: Leslie Curry, female    DOB: [birth year], 80 y.o.   MRN: [id number]  HPI Here with 2 daughters  Hasn't been eating for the past 2 weeks Taste not right Has lost 30# or so---all since Christmas She doesn't feel right---but can't specify what  Pain in right hand since exam 2 days ago  Current Outpatient Prescriptions on File Prior to Visit  Medication Sig Dispense Refill  . azelastine (ASTELIN) 137 MCG/SPRAY nasal spray Place 1 spray into the nose 2 (two) times daily as needed.  30 mL  11  . clopidogrel (PLAVIX) 75 MG tablet TAKE 1 TABLET (75 MG TOTAL) BY MOUTH DAILY.  90 tablet  2  . donepezil (ARICEPT) 10 MG tablet TAKE 1 TABLET BY MOUTH AT BEDTIME  90 tablet  3  . FLUoxetine (PROZAC) 20 MG capsule TAKE ONE CAPSULE BY MOUTH EVERY DAY  90 capsule  3  . furosemide (LASIX) 20 MG tablet Take 20 mg by mouth every other day.      Marland Kitchen HYDROcodone-acetaminophen (NORCO/VICODIN) 5-325 MG per tablet Take 1 tablet by mouth 2 (two) times daily as needed.  60 tablet  0  . levothyroxine (SYNTHROID, LEVOTHROID) 75 MCG tablet TAKE 1 TABLET EVERY DAY  90 tablet  2  . LORazepam (ATIVAN) 0.5 MG tablet Take 0.5 tablets (0.25 mg total) by mouth 2 (two) times daily as needed.  30 tablet  0  . meclizine (ANTIVERT) 25 MG tablet Take 1/2 to 1 by mouth three times a day as needed for dizziness       . metoprolol succinate (TOPROL-XL) 25 MG 24 hr tablet TAKE 1 TABLET BY MOUTH EVERY DAY  90 tablet  2   No current facility-administered medications on file prior to visit.    Allergies  Allergen Reactions  . Aspirin     Past Medical History  Diagnosis Date  . CAD (coronary artery disease) 2006    Anterior MI in [place] in 2006. 2 stents in LAD, 1 stent in PDA. Adenosine myoview  (7/10): EF 60%, septal apical infarct. Low risk study. Adenosine myoview (4/11): EF > 55%, mid anteroseptal,  apical septal, and true apex infarct with no ischemia. Low risk study.  . Hypertension   .  Parotitis     2 prior bouts  . Hypothyroidism   . Depression   . Allergic to aspirin     description sounds like anaphylaxis  . Diastolic CHF, acute     echo (7/10) EF 50-55%, mid to apical anteroseptal  hypokinesis, diatolic dysfunction, PASP 33 mm/Hg  . Osteoarthritis   . Alzheimer's disease   . Rheumatoid arthritis(714.0) 2011    diagnosis in question  . Cataracts, bilateral   . Salivary gland infection 03/09    gland on left    Past Surgical History  Procedure Laterality Date  . Anal sphincterotomy  09/01    and Fissurectomy- [facility]  . Nasal polyp surgery  1965  . Hemorrhoid surgery  01/01    ligation  . Coronary stent placement  03/06    MI/3    Family History  Problem Relation Age of Onset  . Heart disease Father     MI    History   Social History  . Marital Status: Married    Spouse Name: N/A    Number of Children: 4  . Years of Education: N/A   Occupational History  .     Social History Main Topics  .  Smoking status: Never Smoker   . Smokeless tobacco: Never Used  . Alcohol Use: Yes     Comment: Occassionally  . Drug Use: Not on file  . Sexual Activity: Not on file   Other Topics Concern  . Not on file   Social History Narrative   Divorced then remarried-widowed 10/02. 5 daughters.       No living will   Would want daughters Neoma Laming and Otila Kluver to be health care POAs   Not sure about DNR---probably would accept at least attempts   No tube feedings if cognitively unaware   Review of Systems     Objective:   Physical Exam  Musculoskeletal:  Mild tenderness in right hand Decreased grip but no active synovitis (other than mild tenderness on radial wrist)          Assessment & Plan:

## 2014-01-04 NOTE — Assessment & Plan Note (Signed)
Apparent in tail Multiple liver lesions that are probably mets Unable to eat and marked weight loss Very poor functional status They have stopped all the meds Patient queried--she gives the decision to her daughters  After extended discussion and review of the ultrasound-- we will proceed with the CT scan  Just won't drink. Not nauseated Not sure what to do about her even not drinking. They will try pedialyte Drinking some water though

## 2014-01-04 NOTE — Progress Notes (Signed)
Pre visit review using our clinic review tool, if applicable. No additional management support is needed unless otherwise documented below in the visit note. 

## 2014-01-04 NOTE — Addendum Note (Signed)
Addended by: Viviana Simpler I on: 01/04/2014 12:49 PM   Modules accepted: Orders

## 2014-01-08 ENCOUNTER — Telehealth: Payer: Self-pay | Admitting: Internal Medicine

## 2014-01-08 ENCOUNTER — Ambulatory Visit: Payer: Self-pay | Admitting: Internal Medicine

## 2014-01-08 NOTE — Telephone Encounter (Signed)
Phone call from Dr Dorcas Carrow CT scan confirms widespread pancreatic cancer--- large tumor in tail of pancreas with contiguous spread to spleen and probably stomach. At least one abnormal node Lung mets and multiple liver mets seen  Also saw pulmonary embolus in [facility]---may be more also (since not specifically imaged)  Discussed with daughter Leslie Curry by phone I do recommend hospice care Discussed the PE--- I am concerned that she would have bleeding complications with xarelto (or the like), especially with the mass in the spleen. Rx is not likely to provide benefit. She agrees. Still not eating and she and sister having trouble managing her care Asks for immediate help  Hospice referral made to [facility], Please fax demographics and my last OV note and this phone note  to [facility] at [phone]

## 2014-01-09 ENCOUNTER — Encounter: Payer: Self-pay | Admitting: Radiology

## 2014-01-09 ENCOUNTER — Encounter: Payer: Self-pay | Admitting: Internal Medicine

## 2014-01-09 ENCOUNTER — Ambulatory Visit (INDEPENDENT_AMBULATORY_CARE_PROVIDER_SITE_OTHER): Payer: Medicare Other | Admitting: Internal Medicine

## 2014-01-09 VITALS — BP 100/60 | HR 118 | Temp 98.4°F | Wt 145.0 lb

## 2014-01-09 DIAGNOSIS — C787 Secondary malignant neoplasm of liver and intrahepatic bile duct: Secondary | ICD-10-CM

## 2014-01-09 DIAGNOSIS — C259 Malignant neoplasm of pancreas, unspecified: Secondary | ICD-10-CM

## 2014-01-09 DIAGNOSIS — R631 Polydipsia: Secondary | ICD-10-CM | POA: Insufficient documentation

## 2014-01-09 NOTE — Assessment & Plan Note (Signed)
And lung Contiguous spread to spleen and stomach  Hasn't drank any fluids Clearly some degree of dehydration Will have hospice give IV D5NS 1liter today, and repeat tomorrow if needed Will likely need narcotic analgesics soon Some cough-- ?from lung disease  Counseled family for all of 25 minute visit

## 2014-01-09 NOTE — Progress Notes (Signed)
Subjective:    Patient ID: Leslie Curry, female    DOB: [birth year], 80 y.o.   MRN: [id number]  HPI Here with 2 daughters  Reviewed the CT scan She doesn't like the word "cancer" Daughter was contacted by hospice--coming in this afternoon  Has some faith but no active religious experience Discussed that hospice chaplain will come  Still won't eat anything Has stopped drinking at this point Has pedialyte and pops and just won't take it  Now starting to have some pain  Current Outpatient Prescriptions on File Prior to Visit  Medication Sig Dispense Refill  . azelastine (ASTELIN) 137 MCG/SPRAY nasal spray Place 1 spray into the nose 2 (two) times daily as needed.  30 mL  11  . clopidogrel (PLAVIX) 75 MG tablet TAKE 1 TABLET (75 MG TOTAL) BY MOUTH DAILY.  90 tablet  2  . donepezil (ARICEPT) 10 MG tablet TAKE 1 TABLET BY MOUTH AT BEDTIME  90 tablet  3  . FLUoxetine (PROZAC) 20 MG capsule TAKE ONE CAPSULE BY MOUTH EVERY DAY  90 capsule  3  . furosemide (LASIX) 20 MG tablet Take 20 mg by mouth every other day.      Marland Kitchen HYDROcodone-acetaminophen (NORCO/VICODIN) 5-325 MG per tablet Take 1 tablet by mouth 2 (two) times daily as needed.  60 tablet  0  . levothyroxine (SYNTHROID, LEVOTHROID) 75 MCG tablet TAKE 1 TABLET EVERY DAY  90 tablet  2  . LORazepam (ATIVAN) 0.5 MG tablet Take 0.5 tablets (0.25 mg total) by mouth 2 (two) times daily as needed.  30 tablet  0  . meclizine (ANTIVERT) 25 MG tablet Take 1/2 to 1 by mouth three times a day as needed for dizziness       . metoprolol succinate (TOPROL-XL) 25 MG 24 hr tablet TAKE 1 TABLET BY MOUTH EVERY DAY  90 tablet  2   No current facility-administered medications on file prior to visit.    Allergies  Allergen Reactions  . Aspirin     Past Medical History  Diagnosis Date  . CAD (coronary artery disease) 2006    Anterior MI in [place] in 2006. 2 stents in LAD, 1 stent in PDA. Adenosine myoview  (7/10): EF 60%, septal apical infarct.  Low risk study. Adenosine myoview (4/11): EF > 55%, mid anteroseptal,  apical septal, and true apex infarct with no ischemia. Low risk study.  . Hypertension   . Parotitis     2 prior bouts  . Hypothyroidism   . Depression   . Allergic to aspirin     description sounds like anaphylaxis  . Diastolic CHF, acute     echo (7/10) EF 50-55%, mid to apical anteroseptal  hypokinesis, diatolic dysfunction, PASP 33 mm/Hg  . Osteoarthritis   . Alzheimer's disease   . Rheumatoid arthritis(714.0) 2011    diagnosis in question  . Cataracts, bilateral   . Salivary gland infection 03/09    gland on left  . Pancreatic cancer metastasized to liver     Past Surgical History  Procedure Laterality Date  . Anal sphincterotomy  09/01    and Fissurectomy- [facility]  . Nasal polyp surgery  1965  . Hemorrhoid surgery  01/01    ligation  . Coronary stent placement  03/06    MI/3    Family History  Problem Relation Age of Onset  . Heart disease Father     MI    History   Social History  . Marital Status: Married  Spouse Name: N/A    Number of Children: 5  . Years of Education: N/A   Occupational History  .     Social History Main Topics  . Smoking status: Never Smoker   . Smokeless tobacco: Never Used  . Alcohol Use: Yes     Comment: Occassionally  . Drug Use: Not on file  . Sexual Activity: Not on file   Other Topics Concern  . Not on file   Social History Narrative   Divorced then remarried-widowed 10/02. 5 daughters.       No living will   Would want daughters Neoma Laming and Otila Kluver to be health care POAs   Not sure about DNR---probably would accept at least attempts   No tube feedings if cognitively unaware   Review of Systems  Reviewed advanced directives Will do DNR today     Objective:   Physical Exam        Assessment & Plan:

## 2014-01-09 NOTE — Progress Notes (Signed)
Pre visit review using our clinic review tool, if applicable. No additional management support is needed unless otherwise documented below in the visit note. 

## 2014-01-10 ENCOUNTER — Telehealth: Payer: Self-pay | Admitting: *Deleted

## 2014-01-10 MED ORDER — MORPHINE SULFATE (CONCENTRATE) 20 MG/ML PO SOLN: 30.0000 mg | ORAL | Status: AC | PRN

## 2014-01-10 NOTE — Telephone Encounter (Signed)
Called by Jenny Reichmann her hospice nurse [phone]  Getting IV running now Some pain Still trouble swallowing pills Will start with roxanol---narcotic naive so can't use fentanyl patch

## 2014-01-10 NOTE — Telephone Encounter (Signed)
Script handwritten by Dr. Silvio Pate and faxed to pharmacy  Left message on daughter's VM that script was sent to pharmacy.

## 2014-02-04 DEATH — deceased
